# Patient Record
Sex: Male | Born: 1960 | Race: Black or African American | Hispanic: No | Smoking: Current every day smoker
Health system: Southern US, Community
[De-identification: ages and names within clinical notes are randomized; demographics above are authoritative.]

## PROBLEM LIST (undated history)

## (undated) DIAGNOSIS — M109 Gout, unspecified: Secondary | ICD-10-CM

## (undated) DIAGNOSIS — I10 Essential (primary) hypertension: Secondary | ICD-10-CM

## (undated) DIAGNOSIS — E119 Type 2 diabetes mellitus without complications: Secondary | ICD-10-CM

## (undated) DIAGNOSIS — M1990 Unspecified osteoarthritis, unspecified site: Secondary | ICD-10-CM

## (undated) DIAGNOSIS — C61 Malignant neoplasm of prostate: Secondary | ICD-10-CM

## (undated) HISTORY — PX: PROSTATE BIOPSY: SHX241

## (undated) HISTORY — PX: WISDOM TOOTH EXTRACTION: SHX21

---

## 2001-01-01 ENCOUNTER — Ambulatory Visit (HOSPITAL_COMMUNITY): Admission: RE | Admit: 2001-01-01 | Discharge: 2001-01-01 | Payer: Self-pay | Admitting: Internal Medicine

## 2001-01-01 ENCOUNTER — Encounter: Payer: Self-pay | Admitting: Internal Medicine

## 2008-03-14 ENCOUNTER — Ambulatory Visit (HOSPITAL_COMMUNITY): Admission: RE | Admit: 2008-03-14 | Discharge: 2008-03-14 | Payer: Self-pay | Admitting: Family Medicine

## 2008-03-14 ENCOUNTER — Encounter: Payer: Self-pay | Admitting: Orthopedic Surgery

## 2008-04-05 ENCOUNTER — Ambulatory Visit: Payer: Self-pay | Admitting: Orthopedic Surgery

## 2008-04-05 DIAGNOSIS — M25569 Pain in unspecified knee: Secondary | ICD-10-CM | POA: Insufficient documentation

## 2008-04-05 DIAGNOSIS — M25579 Pain in unspecified ankle and joints of unspecified foot: Secondary | ICD-10-CM | POA: Insufficient documentation

## 2012-10-04 ENCOUNTER — Ambulatory Visit (HOSPITAL_COMMUNITY)
Admission: RE | Admit: 2012-10-04 | Discharge: 2012-10-04 | Disposition: A | Discharge status: Home / Self Care | Payer: Commercial Managed Care - PPO | Source: Ambulatory Visit | Attending: Internal Medicine | Admitting: Internal Medicine

## 2012-10-04 ENCOUNTER — Other Ambulatory Visit (HOSPITAL_COMMUNITY): Payer: Self-pay | Admitting: Internal Medicine

## 2012-10-04 DIAGNOSIS — M25579 Pain in unspecified ankle and joints of unspecified foot: Secondary | ICD-10-CM | POA: Insufficient documentation

## 2012-10-04 DIAGNOSIS — S93409A Sprain of unspecified ligament of unspecified ankle, initial encounter: Secondary | ICD-10-CM

## 2013-09-14 ENCOUNTER — Ambulatory Visit (HOSPITAL_COMMUNITY)
Admission: RE | Admit: 2013-09-14 | Discharge: 2013-09-14 | Disposition: A | Discharge status: Home / Self Care | Payer: Commercial Managed Care - PPO | Source: Ambulatory Visit | Attending: Family Medicine | Admitting: Family Medicine

## 2013-09-14 ENCOUNTER — Other Ambulatory Visit (HOSPITAL_COMMUNITY): Payer: Self-pay | Admitting: Family Medicine

## 2013-09-14 DIAGNOSIS — R1031 Right lower quadrant pain: Secondary | ICD-10-CM

## 2013-09-14 MED ORDER — IOHEXOL 300 MG/ML  SOLN
100.0000 mL | Freq: Once | INTRAMUSCULAR | Status: AC | PRN
  Administered 2013-09-14: 100 mL via INTRAVENOUS

## 2013-09-15 ENCOUNTER — Ambulatory Visit (HOSPITAL_COMMUNITY): Payer: Commercial Managed Care - PPO

## 2013-09-15 ENCOUNTER — Other Ambulatory Visit (HOSPITAL_COMMUNITY): Payer: Commercial Managed Care - PPO

## 2013-10-13 ENCOUNTER — Telehealth (INDEPENDENT_AMBULATORY_CARE_PROVIDER_SITE_OTHER): Payer: Self-pay | Admitting: *Deleted

## 2013-10-13 ENCOUNTER — Encounter (INDEPENDENT_AMBULATORY_CARE_PROVIDER_SITE_OTHER): Payer: Self-pay | Admitting: Internal Medicine

## 2013-10-13 ENCOUNTER — Ambulatory Visit (INDEPENDENT_AMBULATORY_CARE_PROVIDER_SITE_OTHER): Payer: Commercial Managed Care - PPO | Admitting: Internal Medicine

## 2013-10-13 ENCOUNTER — Encounter (HOSPITAL_COMMUNITY): Payer: Self-pay | Admitting: Pharmacy Technician

## 2013-10-13 ENCOUNTER — Other Ambulatory Visit (INDEPENDENT_AMBULATORY_CARE_PROVIDER_SITE_OTHER): Payer: Self-pay | Admitting: *Deleted

## 2013-10-13 VITALS — BP 102/70 | HR 65 | Temp 98.6°F | Ht 71.0 in | Wt 214.9 lb

## 2013-10-13 DIAGNOSIS — R52 Pain, unspecified: Secondary | ICD-10-CM

## 2013-10-13 DIAGNOSIS — R1031 Right lower quadrant pain: Secondary | ICD-10-CM

## 2013-10-13 DIAGNOSIS — G8929 Other chronic pain: Secondary | ICD-10-CM | POA: Insufficient documentation

## 2013-10-13 DIAGNOSIS — Z1211 Encounter for screening for malignant neoplasm of colon: Secondary | ICD-10-CM

## 2013-10-13 MED ORDER — PEG-KCL-NACL-NASULF-NA ASC-C 100 G PO SOLR: 1.0000 | Freq: Once | ORAL | Status: DC

## 2013-10-13 NOTE — Progress Notes (Signed)
Subjective:     Patient ID: Keith Braun, male   DOB: 01/11/1961, 53 y.o.   MRN: [id number]  HPI Referred to our office by [facility] (Dr. Gerarda Fraction) for lower abdominal pain/colonoscopy. He was treated for a UTI after seeing Dr. Gerarda Fraction with Cipro 500mg  BID x 7 days. .  Present today with c/o rt lower abdominal  Pain which radiates into his back. He says when he squats or when he sits for a long period of time the pain worsens. Denies any back injury. 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 Lifting sometimes aggravates his pain. He had had this pain about a month. He denies urinary symptoms are having a fever His appetite is good. No weight loss.  Usually has a BM about 3 times a day and then sometimes one a day. He denies seeing any melena or bright red rectal bleeding.   09/14/2013 CT abdomen/pelvis with CM: (Rt lower quadrant pain). IMPRESSION:  1. No acute process in the abdomen or pelvis.  2. Probable moderate to marked right-sided gynecomastia.  Incompletely imaged. Consider physical exam correlation.   09/13/2013 H and H 13.4 and 40.3, MCV 85.0, total bili 0.7, ALP 67, AST 16, ALT 28, Total oprotein 7.5, Albumin 4.5   Review of Systems History reviewed. No pertinent past medical history.  History reviewed. No pertinent past surgical history.  No Known Allergies  No current outpatient prescriptions on file prior to visit.   No current facility-administered medications on file prior to visit.   Divorced,  1 child in good health.      Objective:   Physical Exam  Filed Vitals:   10/13/13 1018  BP: 102/70  Pulse: 65  Temp: 98.6 F (37 C)  Height: 5\' 11"  (1.803 m)  Weight: 214 lb 14.4 oz (97.478 kg)  Alert and oriented. Skin warm and dry. Oral mucosa is moist.   . Sclera anicteric, conjunctivae is pink. Thyroid not enlarged. No cervical lymphadenopathy. Lungs clear. Heart regular rate and rhythm.  Abdomen is soft. Bowel sounds are positive. No  hepatomegaly. No abdominal masses felt. No tenderness today.  No edema to lower extremities.        Assessment:    Rt lower abdominal pain. Sound more MS than anything. When he has pain, it is         worse when bending over or sitting for long periods of time.  He is however due for a screening colonoscopy. He has no alarm symptoms.    Plan:    Screening colonoscopy.The risks and benefits such as perforation, bleeding, and infection were reviewed with the patient and is agreeable.

## 2013-10-13 NOTE — Patient Instructions (Signed)
Screening colonoscopy 

## 2013-10-13 NOTE — Telephone Encounter (Signed)
Patient needs movi prep 

## 2013-11-03 ENCOUNTER — Encounter (HOSPITAL_COMMUNITY): Payer: Self-pay | Admitting: *Deleted

## 2013-11-03 ENCOUNTER — Ambulatory Visit (HOSPITAL_COMMUNITY)
Admission: RE | Admit: 2013-11-03 | Discharge: 2013-11-03 | Disposition: A | Discharge status: Home / Self Care | Payer: Commercial Managed Care - PPO | Source: Ambulatory Visit | Attending: Internal Medicine | Admitting: Internal Medicine

## 2013-11-03 ENCOUNTER — Encounter (HOSPITAL_COMMUNITY)
Admission: RE | Disposition: A | Discharge status: Home / Self Care | Payer: Self-pay | Source: Ambulatory Visit | Attending: Internal Medicine

## 2013-11-03 DIAGNOSIS — K5730 Diverticulosis of large intestine without perforation or abscess without bleeding: Secondary | ICD-10-CM

## 2013-11-03 DIAGNOSIS — Z1211 Encounter for screening for malignant neoplasm of colon: Secondary | ICD-10-CM | POA: Insufficient documentation

## 2013-11-03 DIAGNOSIS — D126 Benign neoplasm of colon, unspecified: Secondary | ICD-10-CM

## 2013-11-03 DIAGNOSIS — R1031 Right lower quadrant pain: Secondary | ICD-10-CM

## 2013-11-03 HISTORY — PX: COLONOSCOPY: SHX5424

## 2013-11-03 SURGERY — COLONOSCOPY
Anesthesia: Moderate Sedation

## 2013-11-03 MED ORDER — MEPERIDINE HCL 50 MG/ML IJ SOLN
INTRAMUSCULAR | Status: AC
  Filled 2013-11-03: qty 1

## 2013-11-03 MED ORDER — MIDAZOLAM HCL 5 MG/5ML IJ SOLN
INTRAMUSCULAR | Status: AC
  Filled 2013-11-03: qty 10

## 2013-11-03 MED ORDER — MIDAZOLAM HCL 5 MG/5ML IJ SOLN
INTRAMUSCULAR | Status: DC | PRN
  Administered 2013-11-03 (×2): 2 mg via INTRAVENOUS
  Administered 2013-11-03: 1 mg via INTRAVENOUS

## 2013-11-03 MED ORDER — STERILE WATER FOR IRRIGATION IR SOLN
Status: DC | PRN
  Administered 2013-11-03: 09:00:00

## 2013-11-03 MED ORDER — MEPERIDINE HCL 50 MG/ML IJ SOLN
INTRAMUSCULAR | Status: DC | PRN
  Administered 2013-11-03 (×2): 25 mg via INTRAVENOUS

## 2013-11-03 MED ORDER — SODIUM CHLORIDE 0.9 % IV SOLN
INTRAVENOUS | Status: DC
  Administered 2013-11-03: 1000 mL via INTRAVENOUS

## 2013-11-03 NOTE — H&P (Signed)
Keith Braun is an 53 y.o. male.   Chief Complaint: Patient is here for colonoscopy. HPI: Patient is 53 year old male is here for screening colonoscopy. He denies change in his bowel habits rectal bleeding. He does complain of intermittent pain in the right lower quadrant in the region of the anterior superior iliac spine and it radiates back. He was recently treated for UTI without resolution of his pain. He is presently pain-free. Family history is negative for CRC.  No past medical history on file.  History reviewed. No pertinent past surgical history.  History reviewed. No pertinent family history. Social History:  reports that he has been smoking.  He does not have any smokeless tobacco history on file. He reports that he drinks alcohol. He reports that he does not use illicit drugs.  Allergies: No Known Allergies  Medications Prior to Admission  Medication Sig Dispense Refill  . naproxen sodium (ALEVE) 220 MG tablet Take 220 mg by mouth daily as needed.      . peg 3350 powder (MOVIPREP) 100 G SOLR Take 1 kit (200 g total) by mouth once.  1 kit  0    No results found for this or any previous visit (from the past 48 hour(s)). No results found.  ROS  Blood pressure 142/94, pulse 74, temperature 98 F (36.7 C), temperature source Oral, resp. rate 16, height 5' 11" (1.803 m), weight 214 lb (97.07 kg), SpO2 100.00%. Physical Exam  Constitutional: He appears well-developed and well-nourished.  HENT:  Mouth/Throat: Oropharynx is clear and moist.  Eyes: Conjunctivae are normal. No scleral icterus.  Neck: No thyromegaly present.  Cardiovascular: Normal rate, regular rhythm and normal heart sounds.   No murmur heard. Respiratory: Effort normal and breath sounds normal.  GI: Soft. He exhibits no distension and no mass. There is no tenderness.  Musculoskeletal: He exhibits no edema.  Lymphadenopathy:    He has no cervical adenopathy.  Neurological: He is alert.  Skin: Skin is  warm and dry.     Assessment/Plan Average risk screening colonoscopy. History of right lower quadrant abdominal pain felt to be musculoskeletal or pain radiating from his back.  Keith Braun U 11/03/2013, 8:31 AM

## 2013-11-03 NOTE — Discharge Instructions (Signed)
Use Aleve only on as-needed basis. High fiber diet. No driving for 24 hours. Physician will contact you with biopsy results    Colonoscopy, Care After Refer to this sheet in the next few weeks. These instructions provide you with information on caring for yourself after your procedure. Your health care provider may also give you more specific instructions. Your treatment has been planned according to current medical practices, but problems sometimes occur. Call your health care provider if you have any problems or questions after your procedure. WHAT TO EXPECT AFTER THE PROCEDURE  After your procedure, it is typical to have the following:  A small amount of blood in your stool.  Moderate amounts of gas and mild abdominal cramping or bloating. HOME CARE INSTRUCTIONS  Do not drive, operate machinery, or sign important documents for 24 hours.  You may shower and resume your regular physical activities, but move at a slower pace for the first 24 hours.  Take frequent rest periods for the first 24 hours.  Walk around or put a warm pack on your abdomen to help reduce abdominal cramping and bloating.  Drink enough fluids to keep your urine clear or pale yellow.  You may resume your normal diet as instructed by your health care provider. Avoid heavy or fried foods that are hard to digest.  Avoid drinking alcohol for 24 hours or as instructed by your health care provider.  Only take over-the-counter or prescription medicines as directed by your health care provider.  If a tissue sample (biopsy) was taken during your procedure:  Do not take aspirin or blood thinners for 7 days, or as instructed by your health care provider.  Do not drink alcohol for 7 days, or as instructed by your health care provider.  Eat soft foods for the first 24 hours. SEEK MEDICAL CARE IF: You have persistent spotting of blood in your stool 2 3 days after the procedure. SEEK IMMEDIATE MEDICAL CARE IF:  You  have more than a small spotting of blood in your stool.  You pass large blood clots in your stool.  Your abdomen is swollen (distended).  You have nausea or vomiting.  You have a fever.  You have increasing abdominal pain that is not relieved with medicine. Document Released: 03/25/2004 Document Revised: 06/01/2013 Document Reviewed: 04/18/2013 [facility] Patient Information 2014 [place]. Colon Polyps Polyps are lumps of extra tissue growing inside the body. Polyps can grow in the large intestine (colon). Most colon polyps are noncancerous (benign). However, some colon polyps can become cancerous over time. Polyps that are larger than a pea may be harmful. To be safe, caregivers remove and test all polyps. CAUSES  Polyps form when mutations in the genes cause your cells to grow and divide even though no more tissue is needed. RISK FACTORS There are a number of risk factors that can increase your chances of getting colon polyps. They include:  Being older than 50 years.  Family history of colon polyps or colon cancer.  Long-term colon diseases, such as colitis or Crohn disease.  Being overweight.  Smoking.  Being inactive.  Drinking too much alcohol. SYMPTOMS  Most small polyps do not cause symptoms. If symptoms are present, they may include:  Blood in the stool. The stool may look dark red or black.  Constipation or diarrhea that lasts longer than 1 week. DIAGNOSIS People often do not know they have polyps until their caregiver finds them during a regular checkup. Your caregiver can use 4 tests  to check for polyps:  Digital rectal exam. The caregiver wears gloves and feels inside the rectum. This test would find polyps only in the rectum.  Barium enema. The caregiver puts a liquid called barium into your rectum before taking X-rays of your colon. Barium makes your colon look white. Polyps are dark, so they are easy to see in the X-ray pictures.  Sigmoidoscopy. A  thin, flexible tube (sigmoidoscope) is placed into your rectum. The sigmoidoscope has a light and tiny camera in it. The caregiver uses the sigmoidoscope to look at the last third of your colon.  Colonoscopy. This test is like sigmoidoscopy, but the caregiver looks at the entire colon. This is the most common method for finding and removing polyps. TREATMENT  Any polyps will be removed during a sigmoidoscopy or colonoscopy. The polyps are then tested for cancer. PREVENTION  To help lower your risk of getting more colon polyps:  Eat plenty of fruits and vegetables. Avoid eating fatty foods.  Do not smoke.  Avoid drinking alcohol.  Exercise every day.  Lose weight if recommended by your caregiver.  Eat plenty of calcium and folate. Foods that are rich in calcium include milk, cheese, and broccoli. Foods that are rich in folate include chickpeas, kidney beans, and spinach. HOME CARE INSTRUCTIONS Keep all follow-up appointments as directed by your caregiver. You may need periodic exams to check for polyps. SEEK MEDICAL CARE IF: You notice bleeding during a bowel movement. Document Released: 05/07/2004 Document Revised: 11/03/2011 Document Reviewed: 10/21/2011 [facility] Patient Information 2014 [place]. High-Fiber Diet Fiber is found in fruits, vegetables, and grains. A high-fiber diet encourages the addition of more whole grains, legumes, fruits, and vegetables in your diet. The recommended amount of fiber for adult males is 38 g per day. For adult females, it is 25 g per day. Pregnant and lactating women should get 28 g of fiber per day. If you have a digestive or bowel problem, ask your caregiver for advice before adding high-fiber foods to your diet. Eat a variety of high-fiber foods instead of only a select few type of foods.  PURPOSE  To increase stool bulk.  To make bowel movements more regular to prevent constipation.  To lower cholesterol.  To prevent overeating. WHEN  IS THIS DIET USED?  It may be used if you have constipation and hemorrhoids.  It may be used if you have uncomplicated diverticulosis (intestine condition) and irritable bowel syndrome.  It may be used if you need help with weight management.  It may be used if you want to add it to your diet as a protective measure against atherosclerosis, diabetes, and cancer. SOURCES OF FIBER  Whole-grain breads and cereals.  Fruits, such as apples, oranges, bananas, berries, prunes, and pears.  Vegetables, such as green peas, carrots, sweet potatoes, beets, broccoli, cabbage, spinach, and artichokes.  Legumes, such split peas, soy, lentils.  Almonds. FIBER CONTENT IN FOODS Starches and Grains / Dietary Fiber (g)  Cheerios, 1 cup / 3 g  Corn Flakes cereal, 1 cup / 0.7 g  Rice crispy treat cereal, 1 cup / 0.3 g  Instant oatmeal (cooked),  cup / 2 g  Frosted wheat cereal, 1 cup / 5.1 g  Brown, long-grain rice (cooked), 1 cup / 3.5 g  White, long-grain rice (cooked), 1 cup / 0.6 g  Enriched macaroni (cooked), 1 cup / 2.5 g Legumes / Dietary Fiber (g)  Baked beans (canned, plain, or vegetarian),  cup / 5.2 g  Kidney beans (canned),  cup / 6.8 g  Pinto beans (cooked),  cup / 5.5 g Breads and Crackers / Dietary Fiber (g)  Plain or honey graham crackers, 2 squares / 0.7 g  Saltine crackers, 3 squares / 0.3 g  Plain, salted pretzels, 10 pieces / 1.8 g  Whole-wheat bread, 1 slice / 1.9 g  White bread, 1 slice / 0.7 g  Raisin bread, 1 slice / 1.2 g  Plain bagel, 3 oz / 2 g  Flour tortilla, 1 oz / 0.9 g  Corn tortilla, 1 small / 1.5 g  Hamburger or hotdog bun, 1 small / 0.9 g Fruits / Dietary Fiber (g)  Apple with skin, 1 medium / 4.4 g  Sweetened applesauce,  cup / 1.5 g  Banana,  medium / 1.5 g  Grapes, 10 grapes / 0.4 g  Orange, 1 small / 2.3 g  Raisin, 1.5 oz / 1.6 g  Melon, 1 cup / 1.4 g Vegetables / Dietary Fiber (g)  Green beans (canned),  cup  / 1.3 g  Carrots (cooked),  cup / 2.3 g  Broccoli (cooked),  cup / 2.8 g  Peas (cooked),  cup / 4.4 g  Mashed potatoes,  cup / 1.6 g  Lettuce, 1 cup / 0.5 g  Corn (canned),  cup / 1.6 g  Tomato,  cup / 1.1 g Document Released: 08/11/2005 Document Revised: 02/10/2012 Document Reviewed: 11/13/2011 [facility] Patient Information 2014 [place]. Diverticulosis Diverticulosis is a common condition that develops when small pouches (diverticula) form in the wall of the colon. The risk of diverticulosis increases with age. It happens more often in people who eat a low-fiber diet. Most individuals with diverticulosis have no symptoms. Those individuals with symptoms usually experience abdominal pain, constipation, or loose stools (diarrhea). HOME CARE INSTRUCTIONS   Increase the amount of fiber in your diet as directed by your caregiver or dietician. This may reduce symptoms of diverticulosis.  Your caregiver may recommend taking a dietary fiber supplement.  Drink at least 6 to 8 glasses of water each day to prevent constipation.  Try not to strain when you have a bowel movement.  Your caregiver may recommend avoiding nuts and seeds to prevent complications, although this is still an uncertain benefit.  Only take over-the-counter or prescription medicines for pain, discomfort, or fever as directed by your caregiver. FOODS WITH HIGH FIBER CONTENT INCLUDE:  Fruits. Apple, peach, pear, tangerine, raisins, prunes.  Vegetables. Brussels sprouts, asparagus, broccoli, cabbage, carrot, cauliflower, romaine lettuce, spinach, summer squash, tomato, winter squash, zucchini.  Starchy Vegetables. Baked beans, kidney beans, lima beans, split peas, lentils, potatoes (with skin).  Grains. Whole wheat bread, brown rice, bran flake cereal, plain oatmeal, white rice, shredded wheat, bran muffins. SEEK IMMEDIATE MEDICAL CARE IF:   You develop increasing pain or severe bloating.  You have an  oral temperature above 102 F (38.9 C), not controlled by medicine.  You develop vomiting or bowel movements that are bloody or black. Document Released: 05/08/2004 Document Revised: 11/03/2011 Document Reviewed: 01/09/2010 [facility] Patient Information 2014 [place].

## 2013-11-03 NOTE — Op Note (Signed)
COLONOSCOPY PROCEDURE REPORT  PATIENT:  Keith Braun  MR#:  [id number] Birthdate:  [birth year], 53 y.o., male Endoscopist:  Dr. Rogene Houston, MD Referred By:  Dr. Sherrilee Gilles. Gerarda Fraction, MD  Procedure Date: 11/03/2013  Procedure:   Colonoscopy  Indications:  Patient is 53 year old African male who is undergoing average risk screening colonoscopy. He was recently evaluated for right lower quadrant abdominal pain felt to be musculoskeletal her pain referred from his back.  Informed Consent:  The procedure and risks were reviewed with the patient and informed consent was obtained.  Medications:  Demerol 50 mg IV Versed 5 mg IV  Description of procedure:  After a digital rectal exam was performed, that colonoscope was advanced from the anus through the rectum and colon to the area of the cecum, ileocecal valve and appendiceal orifice. The cecum was deeply intubated. These structures were well-seen and photographed for the record. From the level of the cecum and ileocecal valve, the scope was slowly and cautiously withdrawn. The mucosal surfaces were carefully surveyed utilizing scope tip to flexion to facilitate fold flattening as needed. The scope was pulled down into the rectum where a thorough exam including retroflexion was performed.  Findings:   Prep satisfactory. Multiple diverticula noted at cecum and ascending colon with a few more scattered through rest of the colon. Two small polyps ablated via cold biopsy from sigmoid colon and submitted together. Normal mucosa of rectum and anorectal junction.   Therapeutic/Diagnostic Maneuvers Performed:  See above  Complications:  None  Cecal Withdrawal Time:  10 minutes  Impression:  Examination performed to cecum. Multiple diverticula at cecum and ascending colon with few more in rest of the colon. Two small polyps ablated via cold biopsy from sigmoid colon and submitted together.  Recommendations:  Standard instructions  given. High fiber diet. Patient advised to keep OTC NSAID use to minimum. I will contact patient with biopsy results and further recommendations.  Keith Braun  11/03/2013 9:05 AM  CC: Dr. Glo Herring., MD & Dr. Rayne Du ref. provider found

## 2013-11-07 ENCOUNTER — Encounter (HOSPITAL_COMMUNITY): Payer: Self-pay | Admitting: Internal Medicine

## 2013-11-17 ENCOUNTER — Encounter (INDEPENDENT_AMBULATORY_CARE_PROVIDER_SITE_OTHER): Payer: Self-pay | Admitting: *Deleted

## 2014-08-16 ENCOUNTER — Encounter (INDEPENDENT_AMBULATORY_CARE_PROVIDER_SITE_OTHER): Payer: Self-pay

## 2014-09-07 IMAGING — CT CT ABD-PELV W/ CM
2 of 4 series · 16 of 46 positions shown, 18 images · IV contrast (Omnipaque 300)
Comparison: None.

CLINICAL DATA: Right lower quadrant pain for 3 weeks.

EXAM:
CT ABDOMEN AND PELVIS WITH CONTRAST
TECHNIQUE: Multidetector CT imaging of the abdomen and pelvis was performed
using the standard protocol following bolus administration of
intravenous contrast.
CONTRAST:  100mL OMNIPAQUE IOHEXOL 300 MG/ML  SOLN

[Series 2: abd_pel_with 5.0 b40f · axial · 0.74mm/px · z∈[-429,-29]mm · 13 of 90 slices shown, 15 images]
[im 5/90  soft-tissue]
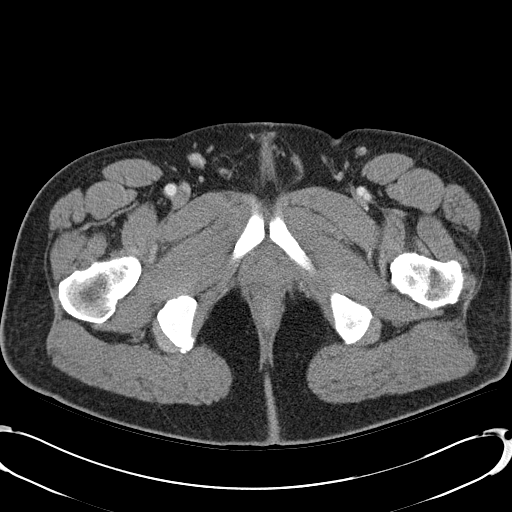
[im 5/90  bone]
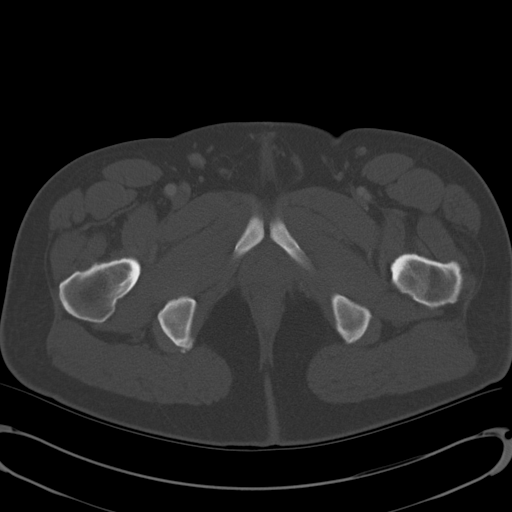
[im 13/90  soft-tissue]
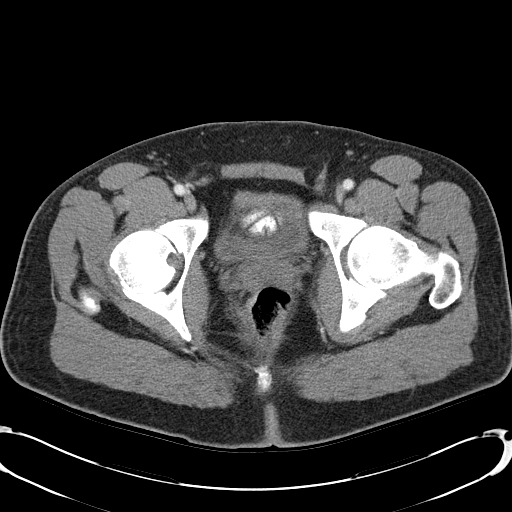
[im 17/90  soft-tissue]
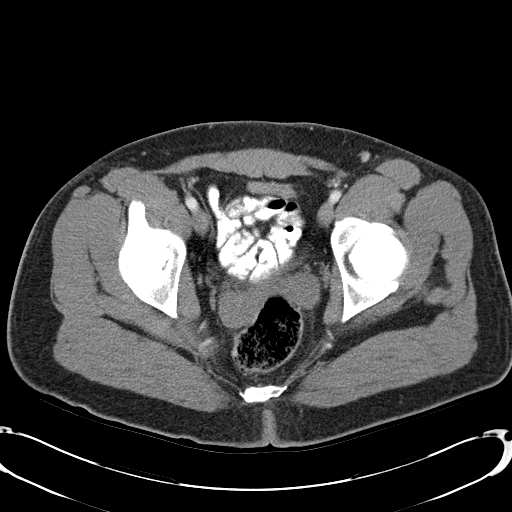
[im 26/90  soft-tissue]
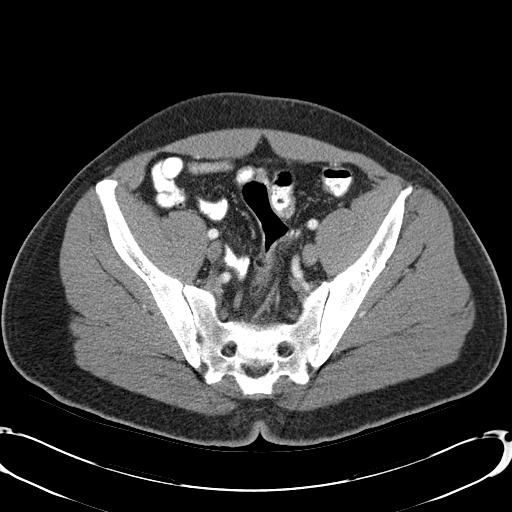
[im 30/90  soft-tissue]
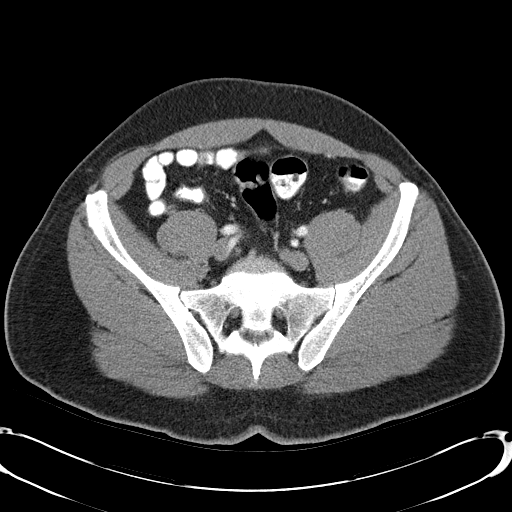
[im 39/90  soft-tissue]
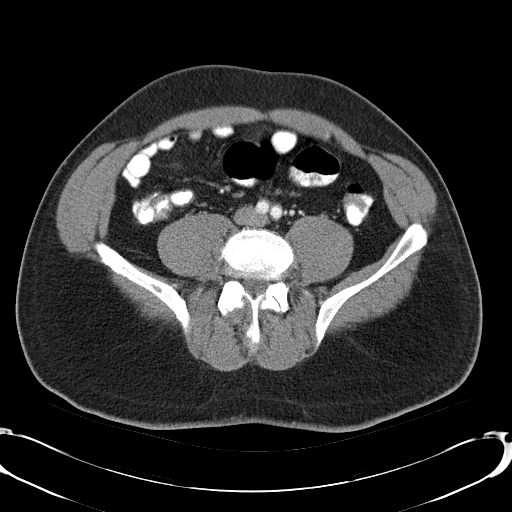
[im 47/90  soft-tissue]
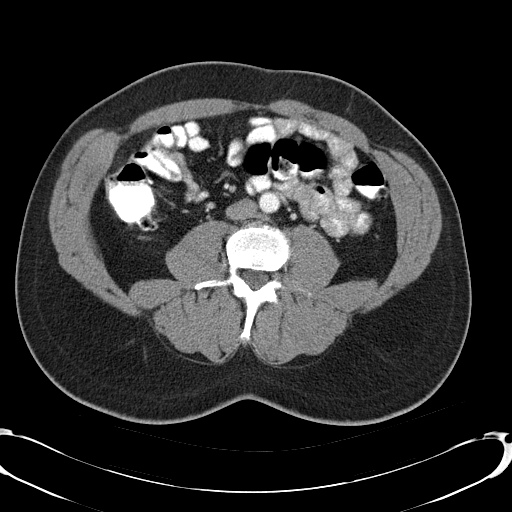
[im 51/90  soft-tissue]
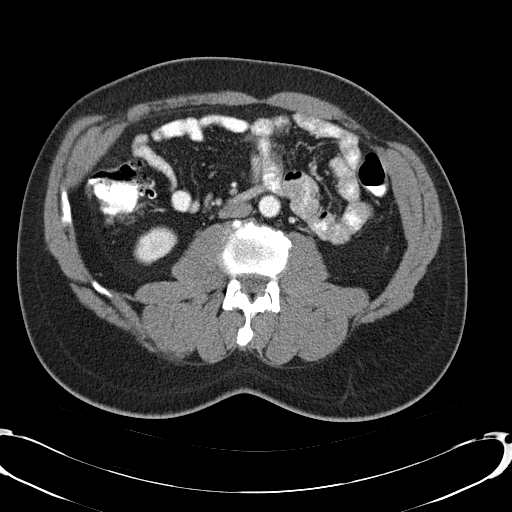
[im 60/90  soft-tissue]
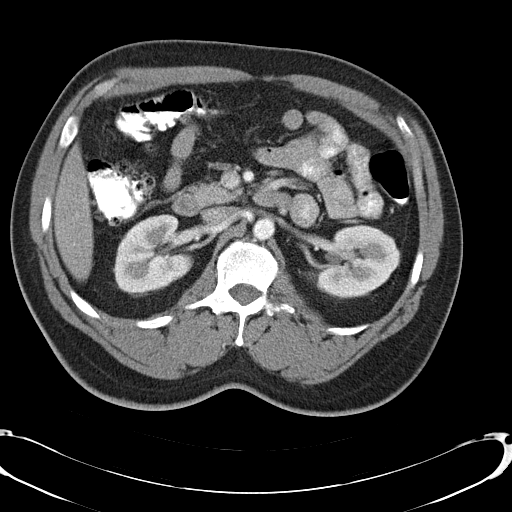
[im 60/90  bone]
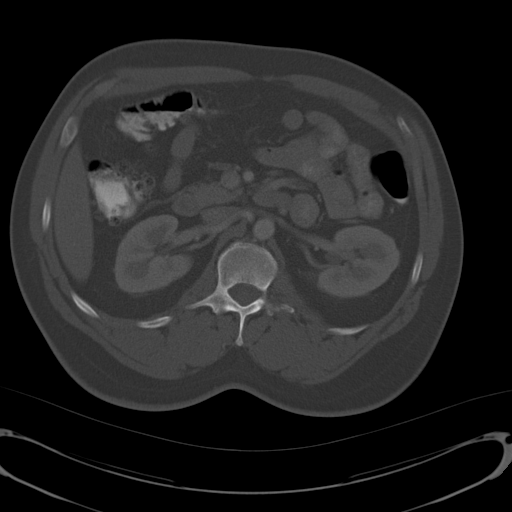
[im 64/90  soft-tissue]
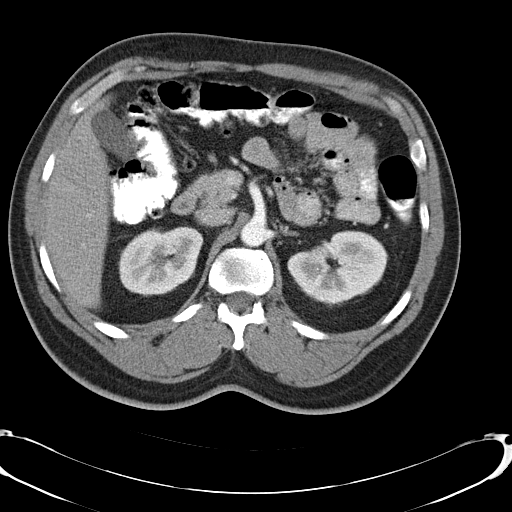
[im 73/90  soft-tissue]
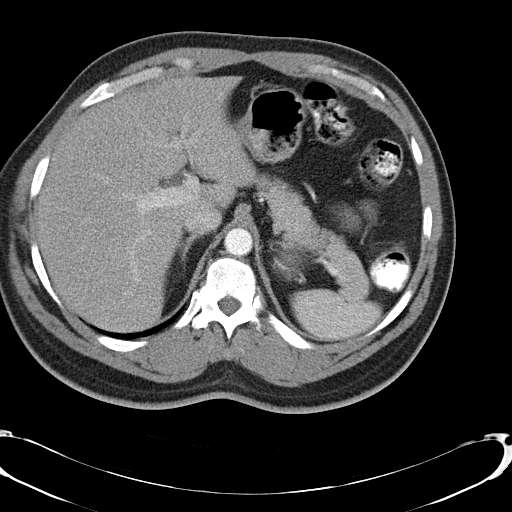
[im 77/90  soft-tissue]
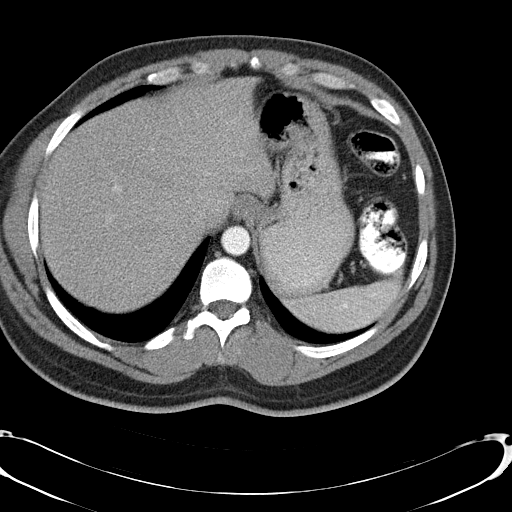
[im 85/90  soft-tissue]
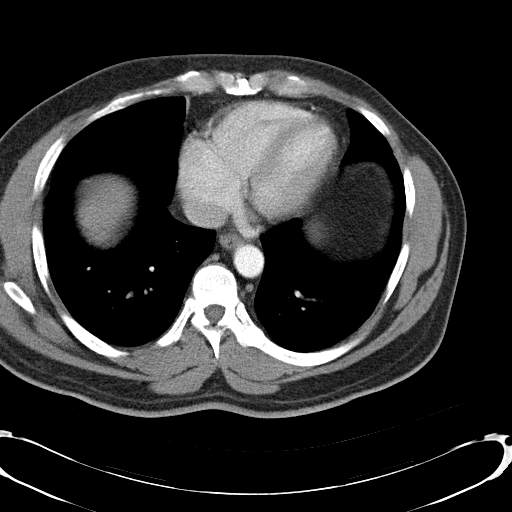

[Series 4: abd_pel_with 3.0 spo · coronal · 0.65mm/px · 3 of 91 slices shown]
[im 31/91  soft-tissue]
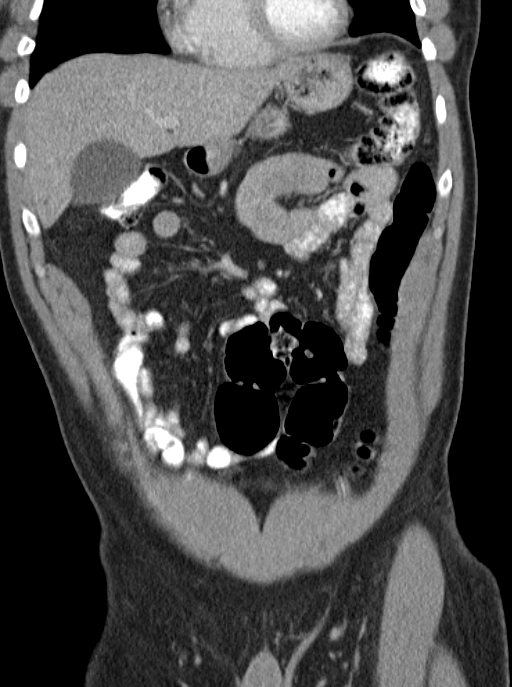
[im 41/91  soft-tissue]
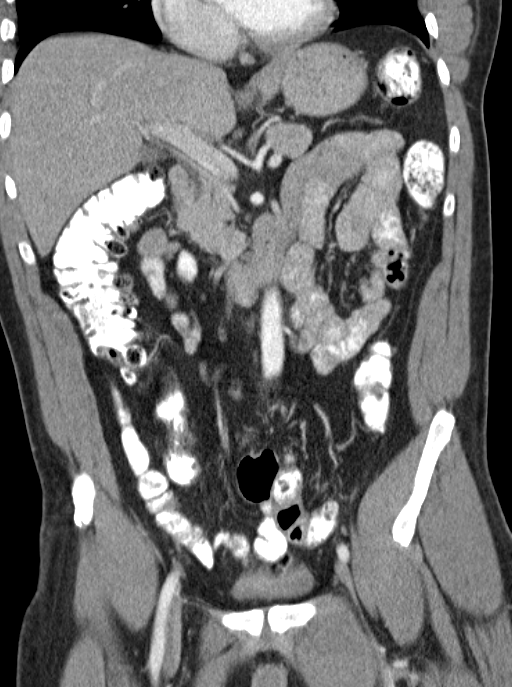
[im 51/91  soft-tissue]
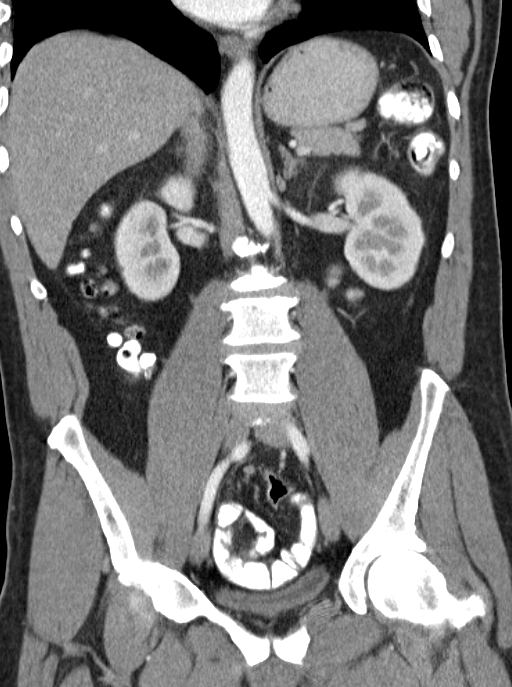

[16 of 46 positions shown; findings below may reference images not displayed]

FINDINGS: Lower Chest: Mild scarring at the anterior left lung base. Normal
heart size without pericardial or pleural effusion. Incompletely
imaged probable moderate to marked right-sided gynecomastia. Image
1.

Abdomen/Pelvis: Normal liver, spleen, stomach, pancreas,
gallbladder, biliary tract, adrenal glands, kidneys. No
retroperitoneal or retrocrural adenopathy. Scattered colonic
diverticula. Normal terminal ileum and appendix. Normal small bowel
without abdominal ascites. Bilateral fat containing inguinal
hernias. No pelvic adenopathy. Normal urinary bladder and prostate.
No significant free fluid.

Bones/Musculoskeletal:  No acute osseous abnormality.
IMPRESSION: 1.  No acute process in the abdomen or pelvis.
2. Probable moderate to marked right-sided gynecomastia.
Incompletely imaged. Consider physical exam correlation.

## 2021-04-25 ENCOUNTER — Ambulatory Visit: Payer: Self-pay | Admitting: Orthopaedic Surgery

## 2021-04-25 ENCOUNTER — Ambulatory Visit: Payer: Self-pay

## 2021-04-25 ENCOUNTER — Encounter: Payer: Self-pay | Admitting: Orthopaedic Surgery

## 2021-04-25 ENCOUNTER — Ambulatory Visit (INDEPENDENT_AMBULATORY_CARE_PROVIDER_SITE_OTHER): Payer: Self-pay | Admitting: Orthopaedic Surgery

## 2021-04-25 ENCOUNTER — Other Ambulatory Visit: Payer: Self-pay

## 2021-04-25 VITALS — BP 113/79 | HR 120 | Ht 71.0 in | Wt 188.6 lb

## 2021-04-25 DIAGNOSIS — G8929 Other chronic pain: Secondary | ICD-10-CM

## 2021-04-25 DIAGNOSIS — M25572 Pain in left ankle and joints of left foot: Secondary | ICD-10-CM

## 2021-04-25 DIAGNOSIS — M25511 Pain in right shoulder: Secondary | ICD-10-CM

## 2021-04-25 DIAGNOSIS — R634 Abnormal weight loss: Secondary | ICD-10-CM

## 2021-04-25 DIAGNOSIS — M1009 Idiopathic gout, multiple sites: Secondary | ICD-10-CM

## 2021-04-25 MED ORDER — PREDNISONE 10 MG (21) PO TBPK: ORAL_TABLET | ORAL | 1 refills | Status: DC

## 2021-04-25 NOTE — Progress Notes (Signed)
Subjective:    Patient ID: Keith Braun, male    DOB: June 26, 1961, 60 y.o.   MRN: [id number]  HPI He has two to three week history of pain in both feet, more on the left foot laterally.  He has no trauma.  He has no redness, no swelling.  He has tried Tylenol, rubs, ice, heat with no help.  He has no change in shoe wear.  He has had problems with the right shoulder for several months, some days more than others.  He drives a fork lift at work.  He has no trauma.  He has more pain with overhead use.  He has no weakness, no numbness.  It has been better over the last week or two.  He has lost weight over the last six weeks.  He weighed 219 then and now weighs 188.  He just does not feel like eating.  He has abdominal pain.  He is scheduled for colonoscopy in another few weeks.  He has seen another provider for this.  He is concerned about his weight loss.  He just does not feel well, does not feel energized like he normally should he says.  His father had gout, his son has gout.  He has never been told he has gout.  He has not been tested for it.   Review of Systems  Constitutional:  Positive for activity change, appetite change, fatigue and unexpected weight change.  Gastrointestinal:  Positive for abdominal pain and constipation.       Weight loss of over 30 pounds over last six weeks without trying to lose weight.  Musculoskeletal:  Positive for arthralgias and gait problem.  All other systems reviewed and are negative. For Review of Systems, all other systems reviewed and are negative.  The following is a summary of the past history medically, past history surgically, known current medicines, social history and family history.  This information is gathered electronically by the computer from prior information and documentation.  I review this each visit and have found including this information at this point in the chart is beneficial and informative.   History reviewed. No pertinent  past medical history.  Past Surgical History:  Procedure Laterality Date   COLONOSCOPY N/A 11/03/2013   Procedure: COLONOSCOPY;  Surgeon: Rogene Houston, MD;  [facility];  Service: Endoscopy;  Laterality: N/A;  830    Current Outpatient Medications on File Prior to Visit  Medication Sig Dispense Refill   naproxen sodium (ALEVE) 220 MG tablet Take 220 mg by mouth daily as needed.     No current facility-administered medications on file prior to visit.    Social History   Socioeconomic History   Marital status: Divorced    Spouse name: Not on file   Number of children: Not on file   Years of education: Not on file   Highest education level: Not on file  Occupational History   Not on file  Tobacco Use   Smoking status: Every Day   Smokeless tobacco: Never   Tobacco comments:    1 pack a week x 30 yrs.   Substance and Sexual Activity   Alcohol use: Yes    Comment: 1/5 liquor a week   Drug use: No   Sexual activity: Not on file  Other Topics Concern   Not on file  Social History Narrative   Not on file   Social Determinants of Health   Financial Resource Strain: Not on  file  Food Insecurity: Not on file  Transportation Needs: Not on file  Physical Activity: Not on file  Stress: Not on file  Social Connections: Not on file  Intimate Partner Violence: Not on file    History reviewed. No pertinent family history.  BP 113/79   Pulse (!) 120   Ht '5\' 11"'$  (1.803 m)   Wt 188 lb 9.6 oz (85.5 kg)   BMI 26.30 kg/m   Body mass index is 26.3 kg/m.     Objective:   Physical Exam Vitals and nursing note reviewed. Exam conducted with a chaperone present.  Constitutional:      Appearance: He is well-developed.  HENT:     Head: Normocephalic and atraumatic.  Eyes:     Conjunctiva/sclera: Conjunctivae normal.     Pupils: Pupils are equal, round, and reactive to light.  Cardiovascular:     Rate and Rhythm: Normal rate and regular rhythm.  Pulmonary:      Effort: Pulmonary effort is normal.  Abdominal:     Palpations: Abdomen is soft.  Musculoskeletal:       Arms:     Cervical back: Normal range of motion and neck supple.       Feet:  Skin:    General: Skin is warm and dry.  Neurological:     Mental Status: He is alert and oriented to person, place, and time.     Cranial Nerves: No cranial nerve deficit.     Motor: No abnormal muscle tone.     Coordination: Coordination normal.     Deep Tendon Reflexes: Reflexes are normal and symmetric. Reflexes normal.  Psychiatric:        Behavior: Behavior normal.        Thought Content: Thought content normal.        Judgment: Judgment normal.  X-rays were done of the right shoulder, left foot and left ankle, reported separately.        Assessment & Plan:   Encounter Diagnoses  Name Primary?   Chronic right shoulder pain Yes   Pain in left ankle and joints of left foot    Acute idiopathic gout of multiple sites    Weight loss, unintentional    I am concerned about the weight loss.  He is scheduled to have colonoscopy.  I am also concerned about possible gout.  I will get serum uric acid levels.  I will avoid giving NSAIDs as he is to have colonoscopy soon.  I will given prednisone dose pack.  I can fill out [facility] paperwork if he gives it to [place].  Return in one week.  Call if any problem.  Precautions discussed.  Electronically Signed Sanjuana Kava, MD 9/1/202211:21 AM

## 2021-04-26 LAB — URIC ACID: Uric Acid, Serum: 7 mg/dL (ref 4.0–8.0)

## 2021-05-02 ENCOUNTER — Encounter: Payer: Self-pay | Admitting: Orthopaedic Surgery

## 2021-05-02 ENCOUNTER — Ambulatory Visit (INDEPENDENT_AMBULATORY_CARE_PROVIDER_SITE_OTHER): Payer: Self-pay | Admitting: Orthopaedic Surgery

## 2021-05-02 ENCOUNTER — Other Ambulatory Visit: Payer: Self-pay

## 2021-05-02 VITALS — Wt 189.2 lb

## 2021-05-02 DIAGNOSIS — G8929 Other chronic pain: Secondary | ICD-10-CM

## 2021-05-02 DIAGNOSIS — M25511 Pain in right shoulder: Secondary | ICD-10-CM

## 2021-05-02 DIAGNOSIS — R634 Abnormal weight loss: Secondary | ICD-10-CM

## 2021-05-02 NOTE — Progress Notes (Signed)
My knee is a little tender.  His uric acid was 7.0, high normal. His right shoulder is less tender.  His weight is stable today, in fact he gained a pound.  He is scheduled for Colonoscopy 9-29.  He had a friend check his blood sugar and it was 295.  He is going to see Dr. Gerarda Fraction after he leaves here.  His other joint pains are less.  He is avoiding sugar products.  Encounter Diagnoses  Name Primary?   Chronic right shoulder pain Yes   Weight loss, unintentional    Check out for diabetes.  Get the colonoscopy.  I cannot give NSAIDs until after the colonoscopy.  Return in one month.  Call if any problem.  Precautions discussed.  Electronically Signed Sanjuana Kava, MD 9/8/202210:45 AM

## 2021-05-23 ENCOUNTER — Ambulatory Visit: Payer: Commercial Managed Care - PPO | Admitting: Urology

## 2021-05-30 ENCOUNTER — Encounter: Payer: Self-pay | Admitting: Orthopaedic Surgery

## 2021-05-30 ENCOUNTER — Other Ambulatory Visit: Payer: Self-pay

## 2021-05-30 ENCOUNTER — Ambulatory Visit (INDEPENDENT_AMBULATORY_CARE_PROVIDER_SITE_OTHER): Payer: Self-pay | Admitting: Orthopaedic Surgery

## 2021-05-30 VITALS — BP 126/86 | HR 76 | Ht 71.5 in | Wt 189.0 lb

## 2021-05-30 DIAGNOSIS — M25511 Pain in right shoulder: Secondary | ICD-10-CM

## 2021-05-30 DIAGNOSIS — G8929 Other chronic pain: Secondary | ICD-10-CM

## 2021-05-30 DIAGNOSIS — M1009 Idiopathic gout, multiple sites: Secondary | ICD-10-CM

## 2021-05-30 MED ORDER — ALLOPURINOL 300 MG PO TABS: 300.0000 mg | ORAL_TABLET | Freq: Every day | ORAL | 5 refills | Status: DC

## 2021-05-30 NOTE — Progress Notes (Signed)
I am better.  He is eating better and not losing weight.  It is stable now.  He did not get his colonoscopy secondary to insurance reasons.  He will get it.  His diabetes is better controlled.  His joints are tender in the right ankle but overall, improved.  ROM of ankle is full bilaterally with slight lateral swelling right ankle.  Shoulders have full ROM.  Encounter Diagnoses  Name Primary?   Chronic right shoulder pain Yes   Acute idiopathic gout of multiple sites    I will begin allopurinol.  Return prn.  Electronically Signed Sanjuana Kava, MD 10/6/202211:16 AM,

## 2021-06-07 ENCOUNTER — Encounter: Payer: Self-pay | Admitting: Orthopaedic Surgery

## 2021-10-17 DIAGNOSIS — Z6825 Body mass index (BMI) 25.0-25.9, adult: Secondary | ICD-10-CM | POA: Diagnosis not present

## 2021-10-17 DIAGNOSIS — R972 Elevated prostate specific antigen [PSA]: Secondary | ICD-10-CM | POA: Diagnosis not present

## 2021-10-17 DIAGNOSIS — E663 Overweight: Secondary | ICD-10-CM | POA: Diagnosis not present

## 2021-10-17 DIAGNOSIS — E118 Type 2 diabetes mellitus with unspecified complications: Secondary | ICD-10-CM | POA: Diagnosis not present

## 2021-10-17 DIAGNOSIS — Z1331 Encounter for screening for depression: Secondary | ICD-10-CM | POA: Diagnosis not present

## 2021-10-17 DIAGNOSIS — E1165 Type 2 diabetes mellitus with hyperglycemia: Secondary | ICD-10-CM | POA: Diagnosis not present

## 2022-05-02 DIAGNOSIS — Z6824 Body mass index (BMI) 24.0-24.9, adult: Secondary | ICD-10-CM | POA: Diagnosis not present

## 2022-05-02 DIAGNOSIS — E118 Type 2 diabetes mellitus with unspecified complications: Secondary | ICD-10-CM | POA: Diagnosis not present

## 2022-05-02 DIAGNOSIS — R972 Elevated prostate specific antigen [PSA]: Secondary | ICD-10-CM | POA: Diagnosis not present

## 2022-05-02 DIAGNOSIS — E119 Type 2 diabetes mellitus without complications: Secondary | ICD-10-CM | POA: Diagnosis not present

## 2023-05-28 DIAGNOSIS — R7303 Prediabetes: Secondary | ICD-10-CM | POA: Diagnosis not present

## 2023-05-28 DIAGNOSIS — E663 Overweight: Secondary | ICD-10-CM | POA: Diagnosis not present

## 2023-05-28 DIAGNOSIS — R972 Elevated prostate specific antigen [PSA]: Secondary | ICD-10-CM | POA: Diagnosis not present

## 2023-05-28 DIAGNOSIS — Z6825 Body mass index (BMI) 25.0-25.9, adult: Secondary | ICD-10-CM | POA: Diagnosis not present

## 2023-05-28 DIAGNOSIS — E118 Type 2 diabetes mellitus with unspecified complications: Secondary | ICD-10-CM | POA: Diagnosis not present

## 2023-05-28 DIAGNOSIS — Z1331 Encounter for screening for depression: Secondary | ICD-10-CM | POA: Diagnosis not present

## 2023-05-28 DIAGNOSIS — Z0001 Encounter for general adult medical examination with abnormal findings: Secondary | ICD-10-CM | POA: Diagnosis not present

## 2023-07-27 NOTE — Progress Notes (Signed)
   07/28/2023 8:22 AM   Keith Braun 12-05-1960 [id number]  Referring provider: Avis Epley, PA-C [address] [place],  [place] [address]  No chief complaint on file.   HPI: 62 yo male is here for further E/M of an elevated PSA. It was drawn @ [facility] in October 2024--> 5.5.    PMH: No past medical history on file.  Surgical History: Past Surgical History:  Procedure Laterality Date   COLONOSCOPY N/A 11/03/2013   Procedure: COLONOSCOPY;  Surgeon: Malissa Hippo, MD;  [facility];  Service: Endoscopy;  Laterality: N/A;  830    Home Medications:  Allergies as of 07/28/2023   No Known Allergies      Medication List        Accurate as of July 27, 2023  8:22 AM. If you have any questions, ask your nurse or doctor.          allopurinol 300 MG tablet Commonly known as: ZYLOPRIM Take 1 tablet (300 mg total) by mouth daily.   metFORMIN 500 MG tablet Commonly known as: GLUCOPHAGE Take 500 mg by mouth daily.   naproxen sodium 220 MG tablet Commonly known as: ALEVE Take 220 mg by mouth daily as needed.        Allergies: No Known Allergies  Family History: No family history on file.  Social History:  reports that he has been smoking. He has never used smokeless tobacco. He reports current alcohol use. He reports that he does not use drugs.  ROS: All other review of systems were reviewed and are negative except what is noted above in HPI  Physical Exam: There were no vitals taken for this visit.  Constitutional:  Alert and oriented, No acute distress. HEENT:  AT, moist mucus membranes.  Trachea midline, no masses. Cardiovascular: No clubbing, cyanosis, or edema. Respiratory: Normal respiratory effort, no increased work of breathing. GI: Abdomen is soft, nontender, nondistended, no abdominal masses GU: No CVA tenderness. Circumcised phallus. No masses/lesions on penis, testis, scrotum. Prostate 40g smooth no nodules  no induration.  Lymph: No cervical or inguinal lymphadenopathy. Skin: No rashes, bruises or suspicious lesions. Neurologic: Grossly intact, no focal deficits, moving all 4 extremities. Psychiatric: Normal mood and affect.  Laboratory Data: No results found for: "WBC", "HGB", "HCT", "MCV", "PLT"  No results found for: "CREATININE"  No results found for: "PSA"  No results found for: "TESTOSTERONE"  No results found for: "HGBA1C"  Urinalysis No results found for: "COLORURINE", "APPEARANCEUR", "LABSPEC", "PHURINE", "GLUCOSEU", "HGBUR", "BILIRUBINUR", "KETONESUR", "PROTEINUR", "UROBILINOGEN", "NITRITE", "LEUKOCYTESUR"  No results found for: "LABMICR", "WBCUA", "RBCUA", "LABEPIT", "MUCUS", "BACTERIA"  Pertinent Imaging: *** No results found for this or any previous visit.  No results found for this or any previous visit.  No results found for this or any previous visit.  No results found for this or any previous visit.  No results found for this or any previous visit.  No valid procedures specified. No results found for this or any previous visit.  No results found for this or any previous visit.   Assessment & Plan:    There are no diagnoses linked to this encounter.  No follow-ups on file.  Chelsea Aus, MD  [facility]

## 2023-07-28 ENCOUNTER — Ambulatory Visit: Payer: Self-pay | Admitting: Urology

## 2023-07-28 ENCOUNTER — Encounter: Payer: Self-pay | Admitting: Urology

## 2023-07-28 VITALS — BP 125/85 | HR 72

## 2023-07-28 DIAGNOSIS — R972 Elevated prostate specific antigen [PSA]: Secondary | ICD-10-CM | POA: Diagnosis not present

## 2023-07-28 DIAGNOSIS — R3129 Other microscopic hematuria: Secondary | ICD-10-CM | POA: Diagnosis not present

## 2023-07-28 LAB — URINALYSIS, ROUTINE W REFLEX MICROSCOPIC
Bilirubin, UA: NEGATIVE
Glucose, UA: NEGATIVE
Ketones, UA: NEGATIVE
Nitrite, UA: NEGATIVE
Protein,UA: NEGATIVE
RBC, UA: NEGATIVE
Specific Gravity, UA: 1.015 (ref 1.005–1.030)
Urobilinogen, Ur: 0.2 mg/dL (ref 0.2–1.0)
pH, UA: 6 (ref 5.0–7.5)

## 2023-07-28 LAB — MICROSCOPIC EXAMINATION: Bacteria, UA: NONE SEEN

## 2023-07-29 LAB — PSA: Prostate Specific Ag, Serum: 4.4 ng/mL — ABNORMAL HIGH (ref 0.0–4.0)

## 2023-07-30 LAB — URINE CULTURE

## 2023-08-03 ENCOUNTER — Other Ambulatory Visit: Payer: Self-pay | Admitting: Urology

## 2023-08-03 DIAGNOSIS — R972 Elevated prostate specific antigen [PSA]: Secondary | ICD-10-CM

## 2023-08-03 MED ORDER — LEVOFLOXACIN 750 MG PO TABS
750.0000 mg | ORAL_TABLET | Freq: Every day | ORAL | 0 refills | Status: AC
Start: 2023-08-03 — End: 2023-08-04

## 2023-08-04 ENCOUNTER — Telehealth: Payer: Self-pay

## 2023-08-04 ENCOUNTER — Other Ambulatory Visit: Payer: Self-pay

## 2023-08-04 DIAGNOSIS — R972 Elevated prostate specific antigen [PSA]: Secondary | ICD-10-CM

## 2023-08-04 NOTE — Telephone Encounter (Signed)
-----   Message from Bertram Millard Dahlstedt sent at 08/03/2023 12:52 PM EST ----- Please call patient-his PSA is still elevated at 4.4.  I would recommend ultrasound and biopsy.  I put order in. ----- Message ----- From: Interface, [facility] Lab Results In Sent: 07/28/2023   3:36 PM EST To: Marcine Matar, MD

## 2023-08-04 NOTE — Telephone Encounter (Signed)
Patient did not want to schedule biopsy at this time.  I scheduled a lab and office visit in 3 months to discuss further at that time.

## 2023-09-07 ENCOUNTER — Other Ambulatory Visit: Payer: Self-pay | Admitting: Urology

## 2023-09-07 DIAGNOSIS — R972 Elevated prostate specific antigen [PSA]: Secondary | ICD-10-CM

## 2023-09-07 MED ORDER — LEVOFLOXACIN 750 MG PO TABS: 750.0000 mg | ORAL_TABLET | Freq: Every day | ORAL | 0 refills | Status: AC

## 2023-09-29 ENCOUNTER — Telehealth: Payer: Self-pay

## 2023-09-29 NOTE — Telephone Encounter (Signed)
-----   Message from [facility] Kourtney B sent at 09/17/2023  4:50 PM EST ----- Can you please get pt set up for prostate biopsy with Dr. Matilda ----- Message ----- From: Matilda Senior, MD Sent: 09/07/2023  12:11 PM EST To: Veleria JONELLE Silversmith, CMA  Please call pt--we got old psa records from [facility]. His PSA in mid 2023 was 4.3, now is 5.5. I would suggest TRUS/Bx as discussed. I put orders in. ----- Message ----- From: McAdoo, Norma J Sent: 07/31/2023  10:05 AM EST To: Senior Matilda, MD

## 2023-09-29 NOTE — Telephone Encounter (Signed)
LVM to call back and have biopsy scheduled with MD Dahlstedt

## 2023-10-05 ENCOUNTER — Other Ambulatory Visit: Payer: Self-pay

## 2023-10-05 DIAGNOSIS — R972 Elevated prostate specific antigen [PSA]: Secondary | ICD-10-CM

## 2023-10-12 ENCOUNTER — Encounter (INDEPENDENT_AMBULATORY_CARE_PROVIDER_SITE_OTHER): Payer: Self-pay | Admitting: *Deleted

## 2023-10-26 ENCOUNTER — Telehealth: Payer: Self-pay

## 2023-10-26 NOTE — Telephone Encounter (Signed)
 Patient called to ask if his prostate biopsy was the same procedure as a colonoscopy.  Patient was educated on the prostate biopsy procedure and voiced understanding.  He will keep follow up as scheduled and call our office back with any questions or concerns.

## 2023-11-02 NOTE — Progress Notes (Signed)
 Keith Braun is here today for ultrasound and biopsy of his prostate.  Past 2 PSA draws have been 5.5 and 4.4.  At the age of 67, I have recommended ultrasound and biopsy.  Risks, benefits, and some of the potential complications of a transrectal ultrasounds of the prostate (TRUSP) with biopsies were discussed at length with the patient including gross hematuria, blood in the bowel movements, hematospermia, bacteremia, infection, voiding discomfort, urinary retention, fever, chills, sepsis, blood transfusion, death, and others. All questions were answered. Informed consent was obtained. The patient confirmed that he had taken his pre-procedure antibiotic. All anticoagulants were discontinued prior to the procedure. The patient emptied his bladder. He was positioned in a comfortable left lateral decubitus position with hips and knees acutely flexed.  The rectal probe was inserted into the rectum without difficulty. 10cc of 2% Lidocaine without epinephrine was instilled with a spinal needle using ultrasound guidance near the junction of each seminal vesicle and the prostate.  Sequential transverse (axial) scans were made in small increments beginning at the seminal vesicles and ending at the prostatic apex. Sequential longitudinal (saggital) scans were made in small increments beginning at the right lateral prostate and ending at the left lateral prostate. Excellent anatomical imaging was obtained. The peripheral, transitional, and central zones were well-defined. The seminal vesicles were normal.  Prostate volume 32 ml.  There were no hypoechoic areas. 12 needle core biopsies were performed. 1 biopsy each was taken from the following areas:  Right lateral base, right medial base, right lateral mid prostate, right medial mid prostate, right lateral apical prostate, right medial apical prostate, left lateral base, left medial base, left lateral mid prostate, left medial mid prostate, left lateral apical  prostate, left medial apical prostate.. Minimal prostatic calcifications were noted. Excellent biopsy specimens were obtained.  Follow-up rectal examination was unremarkable. The procedure was well-tolerated and without complications. Antibiotic instructions were given. The patient was told that:  For several days:  he should increase his fluid intake and limit strenuous activity  he might have mild discomfort at the base of his penis or in his rectum  he might have blood in his urine or blood in his bowel movements  For 2-3 months:  he might have blood in his ejaculate (semen)  Instructions were given to call the office immedicately for blood clots in the urine or bowel movements, difficulty urinating, inability to urinate, urinary retention, painful or frequent urination, fever, chills, nausea, vomiting, or other illness. The patient stated that he understood these instructions and would comply with them. We told the patient that prostate biopsy pathology reports are usually available within 3-5 working days, unless a pathologic second opinion is required, which may take 7-14 days. We told him to contact us to check on the status of his biopsy if he has not heard from [place] within 7 days. The patient left the ultrasound examination room in stable condition.

## 2023-11-03 ENCOUNTER — Other Ambulatory Visit: Payer: Self-pay | Admitting: Urology

## 2023-11-03 ENCOUNTER — Ambulatory Visit (HOSPITAL_COMMUNITY)
Admission: RE | Admit: 2023-11-03 | Discharge: 2023-11-03 | Disposition: A | Discharge status: Home / Self Care | Payer: BC Managed Care – PPO | Source: Ambulatory Visit | Attending: Urology | Admitting: Urology

## 2023-11-03 ENCOUNTER — Ambulatory Visit: Payer: BC Managed Care – PPO | Admitting: Urology

## 2023-11-03 ENCOUNTER — Encounter (HOSPITAL_COMMUNITY): Payer: Self-pay

## 2023-11-03 DIAGNOSIS — R972 Elevated prostate specific antigen [PSA]: Secondary | ICD-10-CM

## 2023-11-03 DIAGNOSIS — C61 Malignant neoplasm of prostate: Secondary | ICD-10-CM

## 2023-11-03 MED ORDER — LIDOCAINE HCL (PF) 2 % IJ SOLN
INTRAMUSCULAR | Status: AC
  Filled 2023-11-03: qty 10

## 2023-11-03 MED ORDER — GENTAMICIN SULFATE 40 MG/ML IJ SOLN
160.0000 mg | Freq: Once | INTRAMUSCULAR | Status: AC
  Administered 2023-11-03: 160 mg via INTRAMUSCULAR

## 2023-11-03 MED ORDER — LIDOCAINE HCL (PF) 2 % IJ SOLN
10.0000 mL | Freq: Once | INTRAMUSCULAR | Status: AC
  Administered 2023-11-03: 10 mL

## 2023-11-03 MED ORDER — GENTAMICIN SULFATE 40 MG/ML IJ SOLN
INTRAMUSCULAR | Status: AC
  Filled 2023-11-03: qty 4

## 2023-11-03 NOTE — Progress Notes (Signed)
 PT tolerated prostate biopsy procedure and antibiotic injection well today. Labs obtained and sent for pathology by Colette from ultrasound. PT ambulatory at discharge with no acute distress noted and verbalized understanding of discharge instructions.

## 2023-11-04 LAB — SURGICAL PATHOLOGY

## 2023-11-10 ENCOUNTER — Other Ambulatory Visit: Payer: BC Managed Care – PPO

## 2023-11-13 ENCOUNTER — Other Ambulatory Visit

## 2023-11-13 DIAGNOSIS — R972 Elevated prostate specific antigen [PSA]: Secondary | ICD-10-CM | POA: Diagnosis not present

## 2023-11-14 LAB — PSA: Prostate Specific Ag, Serum: 17 ng/mL — ABNORMAL HIGH (ref 0.0–4.0)

## 2023-11-16 NOTE — Progress Notes (Signed)
 History of Present Illness: Here to discuss PCa mgmt.  3.11.2025: TRUS/Bx. PSA 5.5, prostate volume 32 mL, PSAD 0.17.  2 cores (Lt base lateral, Rt apex medial) revealed GG 1 and GG 2 pattern in 10 and 20% of cores, respectively.  No past medical history on file.  Past Surgical History:  Procedure Laterality Date   COLONOSCOPY N/A 11/03/2013   Procedure: COLONOSCOPY;  Surgeon: Malissa Hippo, MD;  [facility];  Service: Endoscopy;  Laterality: N/A;  830    Home Medications:  Allergies as of 11/17/2023   No Known Allergies      Medication List        Accurate as of November 16, 2023  3:46 PM. If you have any questions, ask your nurse or doctor.          allopurinol 300 MG tablet Commonly known as: ZYLOPRIM Take 1 tablet (300 mg total) by mouth daily.   metFORMIN 500 MG tablet Commonly known as: GLUCOPHAGE Take 500 mg by mouth daily.   naproxen sodium 220 MG tablet Commonly known as: ALEVE Take 220 mg by mouth daily as needed.        Allergies: No Known Allergies  No family history on file.  Social History:  reports that he has been smoking. He has never used smokeless tobacco. He reports current alcohol use. He reports that he does not use drugs.  ROS: A complete review of systems was performed.  All systems are negative except for pertinent findings as noted.  Physical Exam:  Vital signs in last 24 hours: There were no vitals taken for this visit. Constitutional:  Alert and oriented, No acute distress Cardiovascular: Regular rate  Respiratory: Normal respiratory effort GI: Abdomen is soft, nontender, nondistended, no abdominal masses. No CVAT.  Genitourinary: Normal male phallus, testes are descended bilaterally and non-tender and without masses, scrotum is normal in appearance without lesions or masses, perineum is normal on inspection. Lymphatic: No lymphadenopathy Neurologic: Grossly intact, no focal deficits Psychiatric: Normal mood and  affect  I have reviewed prior pt notes  I have reviewed urinalysis results   I have reviewed recent PSA and pathology results  I have reviewed recent prostate U/S   Impression/Assessment:  ***  Plan:  ***

## 2023-11-17 ENCOUNTER — Ambulatory Visit: Payer: BC Managed Care – PPO | Admitting: Urology

## 2023-11-17 VITALS — BP 125/85 | HR 74

## 2023-11-17 DIAGNOSIS — C61 Malignant neoplasm of prostate: Secondary | ICD-10-CM | POA: Diagnosis not present

## 2023-11-18 LAB — URINALYSIS, ROUTINE W REFLEX MICROSCOPIC
Bilirubin, UA: NEGATIVE
Glucose, UA: NEGATIVE
Ketones, UA: NEGATIVE
Nitrite, UA: NEGATIVE
Protein,UA: NEGATIVE
Specific Gravity, UA: 1.02 (ref 1.005–1.030)
Urobilinogen, Ur: 1 mg/dL (ref 0.2–1.0)
pH, UA: 6 (ref 5.0–7.5)

## 2023-11-18 LAB — MICROSCOPIC EXAMINATION: Bacteria, UA: NONE SEEN

## 2023-12-14 ENCOUNTER — Telehealth: Payer: Self-pay

## 2023-12-14 NOTE — Telephone Encounter (Signed)
 Patient called and would like to move forward with brachytherapy.  Please provide surgery sheet and I will create referral and work on scheduling with patient.

## 2023-12-29 ENCOUNTER — Other Ambulatory Visit: Payer: Self-pay

## 2023-12-29 DIAGNOSIS — C61 Malignant neoplasm of prostate: Secondary | ICD-10-CM

## 2024-01-04 ENCOUNTER — Other Ambulatory Visit: Payer: Self-pay | Admitting: Urology

## 2024-01-04 DIAGNOSIS — C61 Malignant neoplasm of prostate: Secondary | ICD-10-CM

## 2024-01-05 NOTE — Progress Notes (Signed)
 GU Location of Tumor / Histology:  Prostate Ca  If Prostate Cancer, Gleason Score is (3 + 4) and PSA is (17.0 on 11/13/2023)  PSA 4.4 on 07/28/2023  Keith Braun presented as referral from Dr. [name] [facility] [place]).  Biopsies     Past/Anticipated interventions by urology, if any:  11/17/2023 Dr. [name]   Past/Anticipated interventions by medical oncology, if any:  NA  Weight changes, if any:   IPSS: SHIM:  Bowel/Bladder complaints, if any: {:18581}   Nausea/Vomiting, if any: {:18581}  Pain issues, if any:  {:18581}  SAFETY ISSUES: Prior radiation? {:18581} Pacemaker/ICD? {:18581} Possible current pregnancy? Male Is the patient on methotrexate? No  Current Complaints / other details:

## 2024-01-08 ENCOUNTER — Ambulatory Visit
Admission: RE | Admit: 2024-01-08 | Discharge: 2024-01-08 | Disposition: A | Discharge status: Home / Self Care | Source: Ambulatory Visit | Attending: Radiation Oncology | Admitting: Radiation Oncology

## 2024-01-08 ENCOUNTER — Telehealth: Payer: Self-pay | Admitting: *Deleted

## 2024-01-08 ENCOUNTER — Encounter: Payer: Self-pay | Admitting: Radiation Oncology

## 2024-01-08 VITALS — BP 144/100 | HR 78 | Temp 97.7°F | Resp 18 | Ht 71.0 in | Wt 197.8 lb

## 2024-01-08 DIAGNOSIS — Z191 Hormone sensitive malignancy status: Secondary | ICD-10-CM | POA: Diagnosis not present

## 2024-01-08 DIAGNOSIS — Z79899 Other long term (current) drug therapy: Secondary | ICD-10-CM | POA: Diagnosis not present

## 2024-01-08 DIAGNOSIS — E119 Type 2 diabetes mellitus without complications: Secondary | ICD-10-CM | POA: Insufficient documentation

## 2024-01-08 DIAGNOSIS — C61 Malignant neoplasm of prostate: Secondary | ICD-10-CM | POA: Insufficient documentation

## 2024-01-08 DIAGNOSIS — Z7984 Long term (current) use of oral hypoglycemic drugs: Secondary | ICD-10-CM | POA: Insufficient documentation

## 2024-01-08 DIAGNOSIS — M109 Gout, unspecified: Secondary | ICD-10-CM | POA: Diagnosis not present

## 2024-01-08 DIAGNOSIS — F17210 Nicotine dependence, cigarettes, uncomplicated: Secondary | ICD-10-CM | POA: Diagnosis not present

## 2024-01-08 HISTORY — DX: Type 2 diabetes mellitus without complications: E11.9

## 2024-01-08 HISTORY — DX: Malignant neoplasm of prostate: C61

## 2024-01-08 HISTORY — DX: Gout, unspecified: M10.9

## 2024-01-08 NOTE — Progress Notes (Signed)
 Radiation Oncology         [phone] ________________________________  Initial Outpatient Consultation  Name: Keith Braun MRN: [id number]  Date: 01/08/2024  DOB: Jul 10, 1961  QM:VHQIONG, Keith Smart, PA-C  DahlstedtMara Seminole, MD   REFERRING PHYSICIAN: [name], MD  DIAGNOSIS: 63 y.o. gentleman with Stage T1c adenocarcinoma of the prostate with Gleason score of 3+4, and PSA of 4.4.    ICD-10-CM   1. Malignant neoplasm of prostate (HCC)  C61       HISTORY OF PRESENT ILLNESS: [name] is a 63 y.o. male with a diagnosis of prostate cancer. He was noted to have an elevated PSA of 5.5 by his primary care provider, Keith Reading, PA-C.  Accordingly, he was referred for evaluation in urology by [name] on 07/28/23,  digital rectal examination performed at that time showed no nodules or induration. A repeat PSA obtained that day showed a drop but persistent elevation at 4.4. The patient proceeded to transrectal ultrasound with 12 biopsies of the prostate on 11/03/23.  The prostate volume measured 32 cc.  Out of 12 core biopsies, 2 were positive.  The maximum Gleason score was 3+4, and this was seen in the right apex. Additionally, Gleason 3+3 was seen in the left base lateral.  The patient reviewed the biopsy results with his urologist and he has kindly been referred today for discussion of potential radiation treatment options.   PREVIOUS RADIATION THERAPY: No  PAST MEDICAL HISTORY:  Past Medical History:  Diagnosis Date   Diabetes mellitus without complication (HCC)    Gout    Prostate cancer (HCC)       PAST SURGICAL HISTORY: Past Surgical History:  Procedure Laterality Date   COLONOSCOPY N/A 11/03/2013   Procedure: COLONOSCOPY;  Surgeon: [name], MD;  [facility];  Service: Endoscopy;  Laterality: N/A;  830   PROSTATE BIOPSY      FAMILY HISTORY: History reviewed. No pertinent family history.  SOCIAL HISTORY:  Social History    Socioeconomic History   Marital status: Divorced    Spouse name: Not on file   Number of children: Not on file   Years of education: Not on file   Highest education level: Not on file  Occupational History   Not on file  Tobacco Use   Smoking status: Every Day   Smokeless tobacco: Never   Tobacco comments:    1 pack a week x 30 yrs.   Vaping Use   Vaping status: Never Used  Substance and Sexual Activity   Alcohol use: Yes    Comment: 1/5 liquor a week   Drug use: No   Sexual activity: Not on file  Other Topics Concern   Not on file  Social History Narrative   Not on file   Social Drivers of Health   Financial Resource Strain: Not on file  Food Insecurity: No Food Insecurity (01/08/2024)   Hunger Vital Sign    Worried About Running Out of Food in the Last Year: Never true    Ran Out of Food in the Last Year: Never true  Transportation Needs: No Transportation Needs (01/08/2024)   PRAPARE - Administrator, Civil Service (Medical): No    Lack of Transportation (Non-Medical): No  Physical Activity: Not on file  Stress: Not on file  Social Connections: Not on file  Intimate Partner Violence: Not At Risk (01/08/2024)   Humiliation, Afraid, Rape, and Kick questionnaire    Fear of  Current or Ex-Partner: No    Emotionally Abused: No    Physically Abused: No    Sexually Abused: No    ALLERGIES: Patient has no known allergies.  MEDICATIONS:  Current Outpatient Medications  Medication Sig Dispense Refill   acetaminophen (TYLENOL) 650 MG CR tablet Take 650 mg by mouth every 8 (eight) hours as needed for pain.     allopurinol  (ZYLOPRIM ) 300 MG tablet Take 1 tablet (300 mg total) by mouth daily. 30 tablet 5   tadalafil (CIALIS) 20 MG tablet Take by mouth.     metFORMIN (GLUCOPHAGE) 500 MG tablet Take 500 mg by mouth daily. (Patient not taking: Reported on 11/17/2023)     naproxen sodium (ALEVE) 220 MG tablet Take 220 mg by mouth daily as needed. (Patient not  taking: Reported on 07/28/2023)     No current facility-administered medications for this encounter.    REVIEW OF SYSTEMS:  On review of systems, the patient reports that he is doing well overall. He denies any chest pain, shortness of breath, cough, fevers, chills, night sweats, unintended weight changes. He denies any bowel disturbances, and denies abdominal pain, nausea or vomiting. He denies any new musculoskeletal or joint aches or pains. His IPSS was 9, indicating mild urinary symptoms. His SHIM was 15, indicating he has mild-moderate erectile dysfunction. A complete review of systems is obtained and is otherwise negative.    PHYSICAL EXAM:  Wt Readings from Last 3 Encounters:  01/08/24 197 lb 12.8 oz (89.7 kg)  05/30/21 189 lb (85.7 kg)  05/02/21 189 lb 4 oz (85.8 kg)   Temp Readings from Last 3 Encounters:  01/08/24 97.7 F (36.5 C)  11/03/23 98.2 F (36.8 C) (Oral)  11/03/13 97.8 F (36.6 C) (Oral)   BP Readings from Last 3 Encounters:  01/08/24 (!) 144/100  11/17/23 125/85  11/03/23 113/73   Pulse Readings from Last 3 Encounters:  01/08/24 78  11/17/23 74  11/03/23 69    /10  In general this is a well appearing African American man in no acute distress. He's alert and oriented x4 and appropriate throughout the examination. Cardiopulmonary assessment is negative for acute distress, and he exhibits normal effort.     KPS = 100  100 - Normal; no complaints; no evidence of disease. 90   - Able to carry on normal activity; minor signs or symptoms of disease. 80   - Normal activity with effort; some signs or symptoms of disease. 17   - Cares for self; unable to carry on normal activity or to do active work. 60   - Requires occasional assistance, but is able to care for most of his personal needs. 50   - Requires considerable assistance and frequent medical care. 40   - Disabled; requires special care and assistance. 30   - Severely disabled; hospital admission is  indicated although death not imminent. 20   - Very sick; hospital admission necessary; active supportive treatment necessary. 10   - Moribund; fatal processes progressing rapidly. 0     - Dead  Karnofsky DA, Abelmann WH, Craver LS and Burchenal JH [phone]) The use of the nitrogen mustards in the palliative treatment of carcinoma: with particular reference to bronchogenic carcinoma Cancer 1 634-56  LABORATORY DATA:  No results found for: "WBC", "HGB", "HCT", "MCV", "PLT" No results found for: "NA", "K", "CL", "CO2" No results found for: "ALT", "AST", "GGT", "ALKPHOS", "BILITOT"   RADIOGRAPHY: No results found.    IMPRESSION/PLAN: 1. 63 y.o. gentleman  with Stage T1c adenocarcinoma of the prostate with Gleason Score of 3+4, and PSA of 4.4. We discussed the patient's workup and outlined the nature of prostate cancer in this setting. The patient's T stage, Gleason's score, and PSA put him into the favorable intermediate risk group. Accordingly, he is eligible for a variety of potential treatment options including brachytherapy, 5.5 weeks of external radiation, or prostatectomy. We discussed the available radiation techniques, and focused on the details and logistics of delivery. We discussed and outlined the risks, benefits, short and long-term effects associated with radiotherapy and compared and contrasted these with prostatectomy. We discussed the role of SpaceOAR gel in reducing the rectal toxicity associated with radiotherapy.  He appears to have a good understanding of his disease and our treatment recommendations which are of curative intent.  He was encouraged to ask questions that were answered to his stated satisfaction.  At the conclusion of our conversation, the patient is interested in moving forward with brachytherapy and use of SpaceOAR gel to reduce rectal toxicity from radiotherapy.  We will share our discussion with [name] and move forward with scheduling his CT [facility] planning  appointment in the near future.  The patient met briefly with Darlin Ehrlich in our office who will be working closely with him to coordinate OR scheduling and pre and post procedure appointments.  We will contact the pharmaceutical rep to ensure that SpaceOAR is available at the time of procedure.  We enjoyed meeting him today and look forward to continuing to participate in his care.  We personally spent 70 minutes in this encounter including chart review, reviewing radiological studies, meeting face-to-face with the patient, entering orders, coordinating care and completing documentation.    Arta Bihari, PA-C    Kenith Payer, MD  [facility]  Radiation Oncology Direct Dial: [phone]  Fax: [phone] [email or web address]  Skype  LinkedIn   This document serves as a record of services personally performed by Kenith Payer, MD and Keitha Pata, PA-C. It was created on their behalf by Florance Hun, a trained medical scribe. The creation of this record is based on the scribe's personal observations and the provider's statements to them. This document has been checked and approved by the attending provider.

## 2024-01-08 NOTE — Telephone Encounter (Signed)
 CALLED PATIENT TO INFORM OF PRE-SEED APPTS. AND IMPLANT DATE, SPOKE WITH PATIENT AND HE IS AWARE OF THESE APPTS.

## 2024-01-11 DIAGNOSIS — Z191 Hormone sensitive malignancy status: Secondary | ICD-10-CM | POA: Diagnosis not present

## 2024-01-11 DIAGNOSIS — C61 Malignant neoplasm of prostate: Secondary | ICD-10-CM | POA: Diagnosis not present

## 2024-01-11 NOTE — Progress Notes (Signed)
 Introduced myself to the patient as the prostate nurse navigator.  No barriers to care identified at this time.  He is here to discuss his radiation treatment options.  I gave him my business card and asked him to call me with questions or concerns.  Verbalized understanding.  ?

## 2024-01-12 ENCOUNTER — Telehealth: Payer: Self-pay | Admitting: *Deleted

## 2024-01-12 NOTE — Telephone Encounter (Signed)
 CALLED PATIENT TO INFORM OF PRE-SEED APPTS, AND IMPLANT DATE, SPOKE WITH PATIENT AND HE IS AWARE OF THESE APPT. DATES AND TIMES

## 2024-01-22 ENCOUNTER — Other Ambulatory Visit: Payer: Self-pay

## 2024-01-22 MED ORDER — FLEET ENEMA RE ENEM
1.0000 | ENEMA | Freq: Once | RECTAL | 0 refills | Status: AC
Start: 2024-01-22 — End: 2024-01-22

## 2024-02-05 ENCOUNTER — Other Ambulatory Visit: Payer: Self-pay | Admitting: Urology

## 2024-02-05 DIAGNOSIS — C61 Malignant neoplasm of prostate: Secondary | ICD-10-CM

## 2024-02-08 ENCOUNTER — Telehealth: Payer: Self-pay

## 2024-02-08 NOTE — Telephone Encounter (Signed)
-----   Message from Homero Luster sent at 02/08/2024 10:43 AM EDT ----- Regarding: RE: Preop appt He needs a UA and a preop H&P so he will need to be seen. ----- Message ----- From: Dyke Glasser, CMA Sent: 02/08/2024  10:42 AM EDT To: Homero Luster, MD Subject: RE: Preop appt                                 Pt only needs UA correct ? ----- Message ----- From: Homero Luster, MD Sent: 02/05/2024   1:17 PM EDT To: Roselee Cong, LPN; Valene Gash T# Subject: Preop appt                                     Keith Braun is going to have a seed implant on 8/7 and I will be doing it for Dr. Baker Bon.   Please get the patient in with Isa Manuel a week or two prior for a preop visit with UA.     Thanks.

## 2024-02-08 NOTE — Telephone Encounter (Signed)
 Pt was called to schedule appt per MD wrenn for UA and a preop H&P lvm for pt to c/b mychart message was sent

## 2024-03-01 NOTE — Progress Notes (Signed)
 Pre-seed nursing interview for a diagnosis of Stage T1c adenocarcinoma of the prostate with Gleason score of 3+4, and PSA of 4.4  Patient identity verified x2.   Patient states issues as follows...  -Pain: Denies -Fatigue: Denies -Abdomen: Denies -Groin: Denies -Urinary: Denies -Bowels: Denies -Appetite: Good  Patient denies all other related issues at this time.  Meaningful use complete.  Urinary Management medication(s)- None Urology appointment date- 03/2024, with Dr. Matilda at [facility]  No vitals needed for this visit. Ht 5' 11 (1.803 m)   Wt 194 lb (88 kg)   BMI 27.06 kg/m   This concludes the interaction.  NURSE REMINDER: START 'PRE-SEED' EDUCATION VIDEO AT 4:25 mins.  This concludes the interaction.  Rosaline Minerva, LPN

## 2024-03-03 NOTE — Progress Notes (Signed)
 RN spoke with patient to review any questions or barriers prior to upcoming brachytherapy procedure scheduled for 03/31/24.  All questions answered, no additional needs at this time.

## 2024-03-07 NOTE — Progress Notes (Unsigned)
 Name: Keith Braun DOB: 11-Dec-1960 MRN: [id number]  History of Present Illness: [name] is a 63 y.o. male who presents today for follow up visit at [facility].  Relevant History includes: 1. Prostate cancer. - 11/03/2023: TRUS/Bx. PSA 5.5, prostate volume 32 mL, PSAD 0.17. 2 cores (Lt base lateral, Rt apex medial) revealed GG 1 and GG 2 pattern in 10 and 20% of cores, respectively. 2. Erectile dysfunction, mild.  PSA values: - 05/03/2022: 4.3 - 05/29/2023: 5.5 - 07/28/2023: 4.4 - 11/13/2023: 17.0  At last visit with Dr. Matilda on 11/17/2023: He elected to proceed with brachytherapy (currently scheduled for 03/31/2024 with Dr. Watt).  Since last visit: Saw Radiation Oncology (Dr. Patrcia) on 01/08/2024.  Today: He reports right groin pain which started after mowing the grass a few days ago. It seems most noticeable with certain movements or positions. He has history of chronic RLQ pain however today he denies abdominal pain. He denies any bulge sensation in the area of his pain; also denies redness, warmth, swelling, or rash.   He reports urinary frequency, nocturia x1, and occasional hesitancy. He denies urinary urgency, dysuria, gross hematuria, straining to void, or sensations of incomplete emptying. Reports strong urine stream.    Medications: Current Outpatient Medications  Medication Sig Dispense Refill   acetaminophen (TYLENOL) 500 MG tablet Take 500-1,000 mg by mouth every 6 (six) hours as needed for moderate pain (pain score 4-6).     metroNIDAZOLE  (FLAGYL ) 500 MG tablet Take 4 tablets (2,000 mg total) by mouth once for 1 dose. 4 tablet 0   tadalafil (CIALIS) 20 MG tablet Take 20 mg by mouth daily as needed for erectile dysfunction.     No current facility-administered medications for this visit.    Allergies: Allergies  Allergen Reactions   Tomato     Flares eczema     Past Medical History:  Diagnosis Date   Diabetes mellitus  without complication (HCC)    Gout    Prostate cancer [facility])    Past Surgical History:  Procedure Laterality Date   COLONOSCOPY N/A 11/03/2013   Procedure: COLONOSCOPY;  Surgeon: Claudis RAYMOND Rivet, MD;  [facility];  Service: Endoscopy;  Laterality: N/A;  830   PROSTATE BIOPSY     History reviewed. No pertinent family history. Social History   Socioeconomic History   Marital status: Divorced    Spouse name: Not on file   Number of children: Not on file   Years of education: Not on file   Highest education level: Not on file  Occupational History   Not on file  Tobacco Use   Smoking status: Every Day   Smokeless tobacco: Never   Tobacco comments:    1 pack a week x 30 yrs.   Vaping Use   Vaping status: Never Used  Substance and Sexual Activity   Alcohol use: Yes    Comment: 1/5 liquor a week   Drug use: No   Sexual activity: Not on file  Other Topics Concern   Not on file  Social History Narrative   Not on file   Social Drivers of Health   Financial Resource Strain: Not on file  Food Insecurity: No Food Insecurity (01/08/2024)   Hunger Vital Sign    Worried About Running Out of Food in the Last Year: Never true    Ran Out of Food in the Last Year: Never true  Transportation Needs: No Transportation Needs (01/08/2024)   PRAPARE -  Administrator, Civil Service (Medical): No    Lack of Transportation (Non-Medical): No  Physical Activity: Not on file  Stress: Not on file  Social Connections: Not on file  Intimate Partner Violence: Not At Risk (01/08/2024)   Humiliation, Afraid, Rape, and Kick questionnaire    Fear of Current or Ex-Partner: No    Emotionally Abused: No    Physically Abused: No    Sexually Abused: No    Review of Systems Constitutional: Patient denies any unintentional weight loss or change in strength lntegumentary: Patient denies any rashes or pruritus Cardiovascular: Patient denies chest pain or syncope Respiratory: Patient  denies shortness of breath Gastrointestinal: Patient denies nausea, vomiting, constipation, or diarrhea  Musculoskeletal: Patient denies muscle cramps or weakness Neurologic: Patient denies convulsions or seizures Allergic/Immunologic: Patient denies recent allergic reaction(s) Hematologic/Lymphatic: Patient denies bleeding tendencies Endocrine: Patient denies heat/cold intolerance  GU: As per HPI.  OBJECTIVE Vitals:   03/08/24 1601  BP: 124/82  Pulse: 74   There is no height or weight on file to calculate BMI.  Physical Examination Constitutional: No obvious distress; patient is non-toxic appearing  Cardiovascular: No visible lower extremity edema.  Respiratory: The patient does not have audible wheezing/stridor; respirations do not appear labored  Gastrointestinal: Abdomen non-distended Musculoskeletal: Normal ROM of UEs  Skin: No obvious rashes/open sores  Neurologic: CN 2-12 grossly intact Psychiatric: Answered questions appropriately with normal affect  Hematologic/Lymphatic/Immunologic: No obvious bruises or sites of spontaneous bleeding  Urine microscopy: 0-5 WBC/hpf, 3-10 RBC/hpf, 0 bacteria, trichomonas present   ASSESSMENT Malignant neoplasm of prostate (HCC) - Plan: Urinalysis, Routine w reflex microscopic  Elevated PSA - Plan: Urinalysis, Routine w reflex microscopic  Strain of muscle of right groin region  Trichomoniasis - Plan: metroNIDAZOLE  (FLAGYL ) 500 MG tablet  Preop examination  Will treat with Metronidazole  2 grams x1 dose for trichomoniasis found in today's urine.   For right groin strain advised rest, application of ice, and OTC analgesics PRN.  Patient verbalized understanding of and agreement with current plan. All questions were answered.  PLAN Advised the following: 1. Metronidazole  2 grams x1 dose.  2. Return for  brachytherapy as scheduled for 03/31/2024.  Orders Placed This Encounter  Procedures   Urinalysis, Routine w reflex  microscopic    It has been explained that the patient is to follow regularly with their PCP in addition to all other providers involved in their care and to follow instructions provided by these respective offices. Patient advised to contact urology clinic if any urologic-pertaining questions, concerns, new symptoms or problems arise in the interim period.  There are no Patient Instructions on file for this visit.  Electronically signed by:  Lauraine JAYSON Oz, FNP   03/08/24    4:30 PM

## 2024-03-08 ENCOUNTER — Telehealth: Payer: Self-pay | Admitting: *Deleted

## 2024-03-08 ENCOUNTER — Encounter: Payer: Self-pay | Admitting: Urology

## 2024-03-08 ENCOUNTER — Ambulatory Visit: Admitting: Urology

## 2024-03-08 VITALS — BP 124/82 | HR 74

## 2024-03-08 DIAGNOSIS — R972 Elevated prostate specific antigen [PSA]: Secondary | ICD-10-CM | POA: Diagnosis not present

## 2024-03-08 DIAGNOSIS — S39011A Strain of muscle, fascia and tendon of abdomen, initial encounter: Secondary | ICD-10-CM | POA: Diagnosis not present

## 2024-03-08 DIAGNOSIS — Z01818 Encounter for other preprocedural examination: Secondary | ICD-10-CM

## 2024-03-08 DIAGNOSIS — C61 Malignant neoplasm of prostate: Secondary | ICD-10-CM

## 2024-03-08 DIAGNOSIS — A599 Trichomoniasis, unspecified: Secondary | ICD-10-CM

## 2024-03-08 MED ORDER — METRONIDAZOLE 500 MG PO TABS: 2000.0000 mg | ORAL_TABLET | Freq: Once | ORAL | 0 refills | Status: AC

## 2024-03-08 NOTE — Telephone Encounter (Signed)
 Returned patient's phone call, spoke with patient

## 2024-03-09 LAB — URINALYSIS, ROUTINE W REFLEX MICROSCOPIC
Bilirubin, UA: NEGATIVE
Glucose, UA: NEGATIVE
Ketones, UA: NEGATIVE
Protein,UA: NEGATIVE
Specific Gravity, UA: 1.02 (ref 1.005–1.030)
Urobilinogen, Ur: 1 mg/dL (ref 0.2–1.0)
pH, UA: 6 (ref 5.0–7.5)

## 2024-03-09 LAB — MICROSCOPIC EXAMINATION: Bacteria, UA: NONE SEEN

## 2024-03-10 ENCOUNTER — Telehealth: Payer: Self-pay | Admitting: *Deleted

## 2024-03-10 NOTE — Patient Instructions (Addendum)
 SURGICAL WAITING ROOM VISITATION Patients having surgery or a procedure may have no more than 2 support people in the waiting area - these visitors may rotate in the visitor waiting room.   If the patient needs to stay at the hospital during part of their recovery, the visitor guidelines for inpatient rooms apply.  PRE-OP VISITATION  Pre-op nurse will coordinate an appropriate time for 1 support person to accompany the patient in pre-op.  This support person may not rotate.  This visitor will be contacted when the time is appropriate for the visitor to come back in the pre-op area.  Please refer to the [facility] website for the visitor guidelines for Inpatients (after your surgery is over and you are in a regular room).  You are not required to quarantine at this time prior to your surgery. However, you must do this: Hand Hygiene often Do NOT share personal items Notify your provider if you are in close contact with someone who has COVID or you develop fever 100.4 or greater, new onset of sneezing, cough, sore throat, shortness of breath or body aches.  If you test positive for Covid or have been in contact with anyone that has tested positive in the last 10 days please notify you surgeon.    Your procedure is scheduled on:  Thursday March 31, 2024  Report to [facility] Main Entrance: [address] entrance where the [employer] is available.   Report to admitting at: 10:45    AM  Call this number if you have any questions or problems the morning of surgery [phone]  DO NOT EAT OR DRINK ANYTHING AFTER MIDNIGHT THE NIGHT PRIOR TO YOUR SURGERY / PROCEDURE.   FOLLOW  ANY ADDITIONAL PRE OP INSTRUCTIONS YOU RECEIVED FROM YOUR SURGEON'S OFFICE!!!  FLEET ENEMA: Obtain one(1) Fleet Enema (sodium phosphate 7-19 gm / 118 ml enema) and use (according to the directions on the box) the morning of  your surgery.   If you have any questions, please contact your Surgeon's office for  additional information.    Oral Hygiene is also important to reduce your risk of infection.        Remember - BRUSH YOUR TEETH THE MORNING OF SURGERY WITH YOUR REGULAR TOOTHPASTE  Do NOT smoke after Midnight the night before surgery.  STOP TAKING all Vitamins, Herbs and supplements 1 week before your surgery.   Take ONLY these medicines the morning of surgery with A SIP OF WATER : Tylenol                    You may not have any metal on your body including jewelry, and body piercing  Do not wear lotions, powders,  cologne, or deodorant  Men may shave face and neck.  Contacts, Hearing Aids, dentures or bridgework may not be worn into surgery. DENTURES WILL BE REMOVED PRIOR TO SURGERY PLEASE DO NOT APPLY Poly grip OR ADHESIVES!!!   Patients discharged on the day of surgery will not be allowed to drive home.  Someone NEEDS to stay with you for the first 24 hours after anesthesia.  Do not bring your home medications to the hospital. The Pharmacy will dispense medications listed on your medication list to you during your admission in the Hospital.  Please read over the following fact sheets you were given: IF YOU HAVE QUESTIONS ABOUT YOUR PRE-OP INSTRUCTIONS, PLEASE CALL [phone].   [place] - Preparing for Surgery Before surgery, you can play an important role.  Because skin is not sterile, your skin needs to be as free of germs as possible.  You can reduce the number of germs on your skin by washing with CHG (chlorahexidine gluconate) soap before surgery.  CHG is an antiseptic cleaner which kills germs and bonds with the skin to continue killing germs even after washing. Please DO NOT use if you have an allergy to CHG or antibacterial soaps.  If your skin becomes reddened/irritated stop using the CHG and inform your nurse when you arrive at Short Stay. Do not shave (including legs and underarms) for at least 48 hours prior to the first CHG shower.  You may shave your  face/neck.  Please follow these instructions carefully:  1.  Shower with CHG Soap the night before surgery and the  morning of surgery.  2.  If you choose to wash your hair, wash your hair first as usual with your normal  shampoo.  3.  After you shampoo, rinse your hair and body thoroughly to remove the shampoo.                             4.  Use CHG as you would any other liquid soap.  You can apply chg directly to the skin and wash.  Gently with a scrungie or clean washcloth.  5.  Apply the CHG Soap to your body ONLY FROM THE NECK DOWN.   Do not use on face/ open                           Wound or open sores. Avoid contact with eyes, ears mouth and genitals (private parts).                       Wash face,  Genitals (private parts) with your normal soap.             6.  Wash thoroughly, paying special attention to the area where your  surgery  will be performed.  7.  Thoroughly rinse your body with warm water  from the neck down.  8.  DO NOT shower/wash with your normal soap after using and rinsing off the CHG Soap.            9.  Pat yourself dry with a clean towel.            10.  Wear clean pajamas.            11.  Place clean sheets on your bed the night of your first shower and do not  sleep with pets.  ON THE DAY OF SURGERY : Do not apply any lotions/deodorants the morning of surgery.  Please wear clean clothes to the hospital/surgery center.    FAILURE TO FOLLOW THESE INSTRUCTIONS MAY RESULT IN THE CANCELLATION OF YOUR SURGERY  PATIENT SIGNATURE_________________________________  NURSE SIGNATURE__________________________________  ________________________________________________________________________

## 2024-03-10 NOTE — Telephone Encounter (Signed)
 CALLED PATIENT TO REMIND OF PRE-SEED APPTS. FOR 03-11-24, LVM FOR A RETURN CALL

## 2024-03-10 NOTE — Progress Notes (Signed)
 COVID Vaccine received:  []  No [x]  Yes Date of any COVID positive Test in last 90 days:   none  PCP - Lucie Mace, PA-C Cardiologist - none  Chest x-ray -  EKG - 03-11-24  Stress Test -  ECHO -  Cardiac Cath -  CT Coronary Calcium score:   Bowel Prep - []  No  [x]   Yes _1 Fleet enema_  Pacemaker / ICD device [x]  No []  Yes   Spinal Cord Stimulator:[x]  No []  Yes       History of Sleep Apnea? [x]  No []  Yes   CPAP used?- [x]  No []  Yes    Does the patient monitor blood sugar?   []  N/A   [x]  No []  Yes  Patient has: []  NO Hx DM   [x]  Pre-DM   []  DM1  []   DM2 Last A1c was:        on       Blood Thinner / Instructions:  none Aspirin Instructions:  none  ERAS Protocol Ordered: [x]  No  []  Yes Patient is to be NPO after: MN Prior  Dental hx: []  Dentures:  [x]  N/A      []  Bridge or Partial:                   [x]  Loose or Damaged teeth:   Activity level: Able to walk up 2 flights of stairs without becoming significantly short of breath or having chest pain?  []  No   [x]    Yes   Anesthesia review: DM2- no meds, HTN- no meds, gout, smoker  Patient denies any S&S of respiratory illness or Covid - no shortness of breath, fever, cough or chest pain at PAT appointment.  Patient verbalized understanding and agreement to the Pre-Surgical Instructions that were given to them at this PAT appointment. Patient was also educated of the need to review these PAT instructions again prior to his surgery.I reviewed the appropriate phone numbers to call if they have any and questions or concerns.

## 2024-03-11 ENCOUNTER — Ambulatory Visit
Admission: RE | Admit: 2024-03-11 | Discharge: 2024-03-11 | Disposition: A | Discharge status: Home / Self Care | Source: Ambulatory Visit | Attending: Radiation Oncology | Admitting: Radiation Oncology

## 2024-03-11 ENCOUNTER — Encounter (HOSPITAL_COMMUNITY)
Admission: RE | Admit: 2024-03-11 | Discharge: 2024-03-11 | Disposition: A | Discharge status: Home / Self Care | Source: Ambulatory Visit | Attending: Urology | Admitting: Urology

## 2024-03-11 ENCOUNTER — Encounter: Payer: Self-pay | Admitting: Urology

## 2024-03-11 ENCOUNTER — Encounter (HOSPITAL_COMMUNITY): Payer: Self-pay

## 2024-03-11 ENCOUNTER — Ambulatory Visit
Admission: RE | Admit: 2024-03-11 | Discharge: 2024-03-11 | Disposition: A | Discharge status: Home / Self Care | Payer: Self-pay | Source: Ambulatory Visit | Attending: Urology | Admitting: Urology

## 2024-03-11 ENCOUNTER — Other Ambulatory Visit: Payer: Self-pay

## 2024-03-11 VITALS — BP 127/82 | HR 64 | Temp 97.7°F | Resp 14 | Ht 71.0 in | Wt 194.0 lb

## 2024-03-11 VITALS — Ht 71.0 in | Wt 192.0 lb

## 2024-03-11 DIAGNOSIS — C61 Malignant neoplasm of prostate: Secondary | ICD-10-CM | POA: Diagnosis not present

## 2024-03-11 DIAGNOSIS — I440 Atrioventricular block, first degree: Secondary | ICD-10-CM | POA: Diagnosis not present

## 2024-03-11 DIAGNOSIS — R001 Bradycardia, unspecified: Secondary | ICD-10-CM | POA: Diagnosis not present

## 2024-03-11 DIAGNOSIS — E119 Type 2 diabetes mellitus without complications: Secondary | ICD-10-CM | POA: Insufficient documentation

## 2024-03-11 DIAGNOSIS — Z01818 Encounter for other preprocedural examination: Secondary | ICD-10-CM | POA: Insufficient documentation

## 2024-03-11 DIAGNOSIS — I10 Essential (primary) hypertension: Secondary | ICD-10-CM | POA: Insufficient documentation

## 2024-03-11 DIAGNOSIS — Z191 Hormone sensitive malignancy status: Secondary | ICD-10-CM | POA: Diagnosis not present

## 2024-03-11 HISTORY — DX: Unspecified osteoarthritis, unspecified site: M19.90

## 2024-03-11 HISTORY — DX: Essential (primary) hypertension: I10

## 2024-03-11 LAB — BASIC METABOLIC PANEL WITH GFR
Anion gap: 9 (ref 5–15)
BUN: 17 mg/dL (ref 8–23)
CO2: 24 mmol/L (ref 22–32)
Calcium: 9.5 mg/dL (ref 8.9–10.3)
Chloride: 104 mmol/L (ref 98–111)
Creatinine, Ser: 1.16 mg/dL (ref 0.61–1.24)
GFR, Estimated: >60 mL/min (ref >60)
Glucose, Bld: 114 mg/dL — ABNORMAL HIGH (ref 70–99)
Potassium: 4.8 mmol/L (ref 3.5–5.1)
Sodium: 137 mmol/L (ref 135–145)

## 2024-03-11 LAB — CBC
HCT: 47.2 % (ref 39.0–52.0)
Hemoglobin: 14.7 g/dL (ref 13.0–17.0)
MCH: 27.8 pg (ref 26.0–34.0)
MCHC: 31.1 g/dL (ref 30.0–36.0)
MCV: 89.2 fL (ref 80.0–100.0)
Platelets: 203 K/uL (ref 150–400)
RBC: 5.29 MIL/uL (ref 4.22–5.81)
RDW: 14 % (ref 11.5–15.5)
WBC: 7.4 K/uL (ref 4.0–10.5)
nRBC: 0 % (ref 0.0–0.2)

## 2024-03-11 LAB — GLUCOSE, CAPILLARY: Glucose-Capillary: 108 mg/dL — ABNORMAL HIGH (ref 70–99)

## 2024-03-11 LAB — HEMOGLOBIN A1C
Hgb A1c MFr Bld: 5.8 % — ABNORMAL HIGH (ref 4.8–5.6)
Mean Plasma Glucose: 119.76 mg/dL

## 2024-03-11 NOTE — Progress Notes (Signed)
  Radiation Oncology         [phone] ________________________________  Name: Keith Braun MRN: [id number]  Date: 03/11/2024  DOB: Aug 10, 1961  SIMULATION AND TREATMENT PLANNING NOTE PUBIC ARCH STUDY Definitive Seed Implant as Monotherapy  RR:Gjrxdnw, [name], PA-C  Jackson, [name], [place]*  DIAGNOSIS: 63 y.o. gentleman with Stage T1c adenocarcinoma of the prostate with Gleason score of 3+4, and PSA of 4.4.   Oncology History  Malignant neoplasm of prostate (HCC)  11/03/2023 Cancer Staging   Staging form: Prostate, AJCC 8th Edition - Clinical stage from 11/03/2023: Stage IIB (cT1c, cN0, cM0, PSA: 4.4, Grade Group: 2) - Signed by [name], PA-C on 01/08/2024 Histopathologic type: Adenocarcinoma, NOS Stage prefix: Initial diagnosis Prostate specific antigen (PSA) range: Less than 10 Gleason primary pattern: 3 Gleason secondary pattern: 4 Gleason score: 7 Histologic grading system: 5 grade system Number of biopsy cores examined: 12 Number of biopsy cores positive: 2 Location of positive needle core biopsies: Both sides   01/08/2024 Initial Diagnosis   Malignant neoplasm of prostate (HCC)       ICD-10-CM   1. Malignant neoplasm of prostate (HCC)  C61       COMPLEX SIMULATION:  The patient presented today for evaluation for possible prostate seed implant. He was brought to the radiation planning suite and placed supine on the CT couch. A 3-dimensional image study set was obtained in upload to the planning computer. There, on each axial slice, I contoured the prostate gland. Then, using three-dimensional radiation planning tools I reconstructed the prostate in view of the structures from the transperineal needle pathway to assess for possible pubic arch interference. In doing so, I did not appreciate any pubic arch interference. Also, the patient's prostate volume was estimated based on the drawn structure. The volume was 33 cc.  Given the pubic arch appearance and  prostate volume, patient remains a good candidate to proceed with prostate seed implant. Today, he freely provided informed written consent to proceed.    PLAN: The patient will undergo prostate seed implant to 145 Gy.   ________________________________  Donnice LABOR. Patrcia, M.D.

## 2024-03-11 NOTE — Progress Notes (Signed)
 Radiation Oncology         [phone] ________________________________  Outpatient Follow up- Pre-seed visit  Name: Keith Braun MRN: [id number]  Date: 03/11/2024  DOB: May 03, 1961  RR:Gjrxdnw, Lucie PARAS, PA-C  DahlstedtGarnette, MD   REFERRING PHYSICIAN: Matilda Garnette, MD  DIAGNOSIS: 63 y.o. gentleman with  Stage T1c adenocarcinoma of the prostate with Gleason score of 3+4, and PSA of 4.4.     ICD-10-CM   1. Malignant neoplasm of prostate (HCC)  C61       HISTORY OF PRESENT ILLNESS: Keith Braun is a 63 y.o. male with a diagnosis of prostate cancer. He was noted to have an elevated PSA of 5.5 by his primary care provider, Lucie Mace, PA-C.  Accordingly, he was referred for evaluation in urology by Dr. Matilda on 07/28/23,  digital rectal examination performed at that time showed no nodules or induration. A repeat PSA obtained that day showed a drop but persistent elevation at 4.4. The patient proceeded to transrectal ultrasound with 12 biopsies of the prostate on 11/03/23.  The prostate volume measured 32 cc.  Out of 12 core biopsies, 2 were positive.  The maximum Gleason score was 3+4, and this was seen in the right apex. Additionally, Gleason 3+3 was seen in the left base lateral.   The patient reviewed the biopsy results with his urologist and was kindly referred to us  for discussion of potential radiation treatment options. We initially met the patient on 01/08/24 and he was most interested in proceeding with brachytherapy and SpaceOAR gel placement for treatment of his disease. He is here today for his pre-procedure imaging for planning and to answer any additional questions he may have about this treatment.   PREVIOUS RADIATION THERAPY: No  PAST MEDICAL HISTORY:  Past Medical History:  Diagnosis Date   Diabetes mellitus without complication (HCC)    Gout    Prostate cancer (HCC)       PAST SURGICAL HISTORY: Past Surgical History:  Procedure  Laterality Date   COLONOSCOPY N/A 11/03/2013   Procedure: COLONOSCOPY;  Surgeon: Claudis RAYMOND Rivet, MD;  [facility];  Service: Endoscopy;  Laterality: N/A;  830   PROSTATE BIOPSY      FAMILY HISTORY: History reviewed. No pertinent family history.  SOCIAL HISTORY:  Social History   Socioeconomic History   Marital status: Divorced    Spouse name: Not on file   Number of children: Not on file   Years of education: Not on file   Highest education level: Not on file  Occupational History   Not on file  Tobacco Use   Smoking status: Every Day   Smokeless tobacco: Never   Tobacco comments:    1 pack a week x 30 yrs.   Vaping Use   Vaping status: Never Used  Substance and Sexual Activity   Alcohol use: Yes    Comment: 1/5 liquor a week   Drug use: No   Sexual activity: Not on file  Other Topics Concern   Not on file  Social History Narrative   Not on file   Social Drivers of Health   Financial Resource Strain: Not on file  Food Insecurity: No Food Insecurity (03/11/2024)   Hunger Vital Sign    Worried About Running Out of Food in the Last Year: Never true    Ran Out of Food in the Last Year: Never true  Transportation Needs: No Transportation Needs (03/11/2024)   PRAPARE - Transportation  Lack of Transportation (Medical): No    Lack of Transportation (Non-Medical): No  Physical Activity: Not on file  Stress: Not on file  Social Connections: Not on file  Intimate Partner Violence: Not At Risk (03/11/2024)   Humiliation, Afraid, Rape, and Kick questionnaire    Fear of Current or Ex-Partner: No    Emotionally Abused: No    Physically Abused: No    Sexually Abused: No    ALLERGIES: Tomato  MEDICATIONS:  Current Outpatient Medications  Medication Sig Dispense Refill   acetaminophen (TYLENOL) 500 MG tablet Take 500-1,000 mg by mouth every 6 (six) hours as needed for moderate pain (pain score 4-6).     tadalafil (CIALIS) 20 MG tablet Take 20 mg by mouth  daily as needed for erectile dysfunction.     No current facility-administered medications for this encounter.    REVIEW OF SYSTEMS:  On review of systems, the patient reports that he is doing well overall. He denies any chest pain, shortness of breath, cough, fevers, chills, night sweats, unintended weight changes. He denies any bowel disturbances, and denies abdominal pain, nausea or vomiting. He denies any new musculoskeletal or joint aches or pains. His IPSS was 9, indicating mild urinary symptoms. His SHIM was 15, indicating he has mild-moderate erectile dysfunction. A complete review of systems is obtained and is otherwise negative.     PHYSICAL EXAM:  Wt Readings from Last 3 Encounters:  03/11/24 192 lb (87.1 kg)  01/08/24 197 lb 12.8 oz (89.7 kg)  05/30/21 189 lb (85.7 kg)   Temp Readings from Last 3 Encounters:  01/08/24 97.7 F (36.5 C)  11/03/23 98.2 F (36.8 C) (Oral)  11/03/13 97.8 F (36.6 C) (Oral)   BP Readings from Last 3 Encounters:  03/08/24 124/82  01/08/24 (!) 144/100  11/17/23 125/85   Pulse Readings from Last 3 Encounters:  03/08/24 74  01/08/24 78  11/17/23 74    /10  In general this is a well appearing African American male in no acute distress. He's alert and oriented x4 and appropriate throughout the examination. Cardiopulmonary assessment is negative for acute distress, and he exhibits normal effort.     KPS = 100  100 - Normal; no complaints; no evidence of disease. 90   - Able to carry on normal activity; minor signs or symptoms of disease. 80   - Normal activity with effort; some signs or symptoms of disease. 71   - Cares for self; unable to carry on normal activity or to do active work. 60   - Requires occasional assistance, but is able to care for most of his personal needs. 50   - Requires considerable assistance and frequent medical care. 40   - Disabled; requires special care and assistance. 30   - Severely disabled; hospital admission  is indicated although death not imminent. 20   - Very sick; hospital admission necessary; active supportive treatment necessary. 10   - Moribund; fatal processes progressing rapidly. 0     - Dead  Karnofsky DA, Abelmann WH, Craver LS and Burchenal JH [phone]) The use of the nitrogen mustards in the palliative treatment of carcinoma: with particular reference to bronchogenic carcinoma Cancer 1 [phone]  LABORATORY DATA:  No results found for: WBC, HGB, HCT, MCV, PLT No results found for: NA, K, CL, CO2 No results found for: ALT, AST, GGT, ALKPHOS, BILITOT   RADIOGRAPHY: No results found.    IMPRESSION/PLAN: 1. 63 y.o. gentleman with  Stage T1c adenocarcinoma of the  prostate with Gleason score of 3+4, and PSA of 4.4.  The patient has elected to proceed with seed implant for treatment of his disease. We reviewed the risks, benefits, short and long-term effects associated with brachytherapy and discussed the role of SpaceOAR in reducing the rectal toxicity associated with radiotherapy.  He appears to have a good understanding of his disease and our treatment recommendations which are of curative intent.  He was encouraged to ask questions that were answered to his stated satisfaction. He has freely signed written consent to proceed today in the office and a copy of this document will be placed in his medical record. His procedure is tentatively scheduled for 03/31/24 in collaboration with Dr. Watt and we will see him back for his post-procedure visit approximately 3 weeks thereafter. We look forward to continuing to participate in his care. He knows that he is welcome to call with any questions or concerns at any time in the interim.  I personally spent 30 minutes in this encounter including chart review, reviewing radiological studies, meeting face-to-face with the patient, entering orders and completing documentation.    Sabra MICAEL Rusk, MMS, PA-C [place]  [facility] Radiation Oncology Physician Assistant Direct Dial: [phone]  Fax: [phone]

## 2024-03-30 ENCOUNTER — Telehealth: Payer: Self-pay | Admitting: *Deleted

## 2024-03-30 NOTE — H&P (Signed)
 Name: Keith Braun DOB: 11/01/1960 MRN: [id number]  History of Present Illness: Mr. Memoli is a 63 y.o. male who presents today for follow up visit at [facility].  Relevant History includes: 1. Prostate cancer. - 11/03/2023: TRUS/Bx. PSA 5.5, prostate volume 32 mL, PSAD 0.17. 2 cores (Lt base lateral, Rt apex medial) revealed GG 1 and GG 2 pattern in 10 and 20% of cores, respectively. 2. Erectile dysfunction, mild.  PSA values: - 05/03/2022: 4.3 - 05/29/2023: 5.5 - 07/28/2023: 4.4 - 11/13/2023: 17.0  At last visit with Dr. Matilda on 11/17/2023: He elected to proceed with brachytherapy (currently scheduled for 03/31/2024 with Dr. Watt).  Since last visit: Saw Radiation Oncology (Dr. Patrcia) on 01/08/2024.  Today: He reports right groin pain which started after mowing the grass a few days ago. It seems most noticeable with certain movements or positions. He has history of chronic RLQ pain however today he denies abdominal pain. He denies any bulge sensation in the area of his pain; also denies redness, warmth, swelling, or rash.   He reports urinary frequency, nocturia x1, and occasional hesitancy. He denies urinary urgency, dysuria, gross hematuria, straining to void, or sensations of incomplete emptying. Reports strong urine stream.    Medications: No current facility-administered medications for this encounter.   Current Outpatient Medications  Medication Sig Dispense Refill   acetaminophen  (TYLENOL ) 500 MG tablet Take 500-1,000 mg by mouth every 6 (six) hours as needed for moderate pain (pain score 4-6).     tadalafil (CIALIS) 20 MG tablet Take 20 mg by mouth daily as needed for erectile dysfunction.      Allergies: Allergies  Allergen Reactions   Tomato     Flares eczema     Past Medical History:  Diagnosis Date   Arthritis    Gout   Diabetes mellitus without complication (HCC)    Gout    Hypertension    no meds   Prostate cancer  [facility])    Past Surgical History:  Procedure Laterality Date   COLONOSCOPY N/A 11/03/2013   Procedure: COLONOSCOPY;  Surgeon: Claudis RAYMOND Rivet, MD;  Location: [facility];  Service: Endoscopy;  Laterality: N/A;  830   PROSTATE BIOPSY     WISDOM TOOTH EXTRACTION     No family history on file. Social History   Socioeconomic History   Marital status: Divorced    Spouse name: Not on file   Number of children: Not on file   Years of education: Not on file   Highest education level: Not on file  Occupational History   Not on file  Tobacco Use   Smoking status: Every Day    Types: Cigarettes, Cigars   Smokeless tobacco: Never   Tobacco comments:    1 pack a week x 30 yrs.   Vaping Use   Vaping status: Never Used  Substance and Sexual Activity   Alcohol use: Yes    Comment: 1/5 liquor a week   Drug use: No   Sexual activity: Not Currently  Other Topics Concern   Not on file  Social History Narrative   Not on file   Social Drivers of Health   Financial Resource Strain: Not on file  Food Insecurity: No Food Insecurity (03/11/2024)   Hunger Vital Sign    Worried About Running Out of Food in the Last Year: Never true    Ran Out of Food in the Last Year: Never true  Transportation Needs: No Transportation Needs (03/11/2024)  PRAPARE - Administrator, Civil Service (Medical): No    Lack of Transportation (Non-Medical): No  Physical Activity: Not on file  Stress: Not on file  Social Connections: Not on file  Intimate Partner Violence: Not At Risk (03/11/2024)   Humiliation, Afraid, Rape, and Kick questionnaire    Fear of Current or Ex-Partner: No    Emotionally Abused: No    Physically Abused: No    Sexually Abused: No    Review of Systems Constitutional: Patient denies any unintentional weight loss or change in strength lntegumentary: Patient denies any rashes or pruritus Cardiovascular: Patient denies chest pain or syncope Respiratory: Patient denies  shortness of breath Gastrointestinal: Patient denies nausea, vomiting, constipation, or diarrhea  Musculoskeletal: Patient denies muscle cramps or weakness Neurologic: Patient denies convulsions or seizures Allergic/Immunologic: Patient denies recent allergic reaction(s) Hematologic/Lymphatic: Patient denies bleeding tendencies Endocrine: Patient denies heat/cold intolerance  GU: As per HPI.  OBJECTIVE There were no vitals filed for this visit.  There is no height or weight on file to calculate BMI.  Physical Examination Constitutional: No obvious distress; patient is non-toxic appearing  Cardiovascular: No visible lower extremity edema.  Respiratory: The patient does not have audible wheezing/stridor; respirations do not appear labored  Gastrointestinal: Abdomen non-distended Musculoskeletal: Normal ROM of UEs  Skin: No obvious rashes/open sores  Neurologic: CN 2-12 grossly intact Psychiatric: Answered questions appropriately with normal affect  Hematologic/Lymphatic/Immunologic: No obvious bruises or sites of spontaneous bleeding  Urine microscopy: 0-5 WBC/hpf, 3-10 RBC/hpf, 0 bacteria, trichomonas present   ASSESSMENT No diagnosis found.  Will treat with Metronidazole  2 grams x1 dose for trichomoniasis found in today's urine.   For right groin strain advised rest, application of ice, and OTC analgesics PRN.  Patient verbalized understanding of and agreement with current plan. All questions were answered.  PLAN Advised the following: 1. Metronidazole  2 grams x1 dose.  2. No follow-ups on file.  No orders of the defined types were placed in this encounter.   It has been explained that the patient is to follow regularly with their PCP in addition to all other providers involved in their care and to follow instructions provided by these respective offices. Patient advised to contact urology clinic if any urologic-pertaining questions, concerns, new symptoms or problems  arise in the interim period.  There are no outpatient Patient Instructions on file for this admission.  Electronically signed by:  Norleen Seltzer, MD   03/30/24    4:15 PM

## 2024-03-30 NOTE — Telephone Encounter (Signed)
 CALLED PATIENT TO REMIND OF PROCEDURE, LVM FOR A RETURN CALL

## 2024-03-31 ENCOUNTER — Encounter (HOSPITAL_COMMUNITY): Payer: Self-pay | Admitting: Urology

## 2024-03-31 ENCOUNTER — Ambulatory Visit (HOSPITAL_COMMUNITY): Payer: Self-pay | Admitting: Anesthesiology

## 2024-03-31 ENCOUNTER — Ambulatory Visit (HOSPITAL_COMMUNITY)
Admission: RE | Admit: 2024-03-31 | Discharge: 2024-03-31 | Disposition: A | Discharge status: Home / Self Care | Source: Ambulatory Visit | Attending: Urology | Admitting: Urology

## 2024-03-31 ENCOUNTER — Ambulatory Visit (HOSPITAL_COMMUNITY)

## 2024-03-31 ENCOUNTER — Encounter (HOSPITAL_COMMUNITY)
Admission: RE | Disposition: A | Discharge status: Home / Self Care | Payer: Self-pay | Source: Ambulatory Visit | Attending: Urology

## 2024-03-31 DIAGNOSIS — E119 Type 2 diabetes mellitus without complications: Secondary | ICD-10-CM | POA: Diagnosis not present

## 2024-03-31 DIAGNOSIS — I10 Essential (primary) hypertension: Secondary | ICD-10-CM | POA: Diagnosis not present

## 2024-03-31 DIAGNOSIS — C61 Malignant neoplasm of prostate: Secondary | ICD-10-CM | POA: Diagnosis not present

## 2024-03-31 DIAGNOSIS — F17210 Nicotine dependence, cigarettes, uncomplicated: Secondary | ICD-10-CM | POA: Diagnosis not present

## 2024-03-31 DIAGNOSIS — A599 Trichomoniasis, unspecified: Secondary | ICD-10-CM | POA: Insufficient documentation

## 2024-03-31 DIAGNOSIS — Z01818 Encounter for other preprocedural examination: Secondary | ICD-10-CM

## 2024-03-31 DIAGNOSIS — N529 Male erectile dysfunction, unspecified: Secondary | ICD-10-CM | POA: Insufficient documentation

## 2024-03-31 HISTORY — PX: RADIOACTIVE SEED IMPLANT: SHX5150

## 2024-03-31 LAB — GLUCOSE, CAPILLARY: Glucose-Capillary: 106 mg/dL — ABNORMAL HIGH (ref 70–99)

## 2024-03-31 SURGERY — INSERTION, RADIATION SOURCE, PROSTATE
Anesthesia: General

## 2024-03-31 MED ORDER — OXYCODONE HCL 5 MG PO TABS
5.0000 mg | ORAL_TABLET | Freq: Once | ORAL | Status: AC | PRN
  Administered 2024-03-31: 5 mg via ORAL

## 2024-03-31 MED ORDER — CIPROFLOXACIN IN D5W 400 MG/200ML IV SOLN
400.0000 mg | INTRAVENOUS | Status: AC
  Administered 2024-03-31: 400 mg via INTRAVENOUS
  Filled 2024-03-31: qty 200

## 2024-03-31 MED ORDER — LACTATED RINGERS IV SOLN: INTRAVENOUS | Status: DC

## 2024-03-31 MED ORDER — STERILE WATER FOR IRRIGATION IR SOLN
Status: DC | PRN
  Administered 2024-03-31: 500 mL

## 2024-03-31 MED ORDER — ORAL CARE MOUTH RINSE: 15.0000 mL | Freq: Once | OROMUCOSAL | Status: DC

## 2024-03-31 MED ORDER — HYDROCODONE-ACETAMINOPHEN 5-325 MG PO TABS: 1.0000 | ORAL_TABLET | Freq: Four times a day (QID) | ORAL | 0 refills | Status: AC | PRN

## 2024-03-31 MED ORDER — PHENYLEPHRINE HCL (PRESSORS) 10 MG/ML IV SOLN
INTRAVENOUS | Status: DC | PRN
Start: 2024-03-31 — End: 2024-03-31
  Administered 2024-03-31: 100 ug via INTRAVENOUS
  Administered 2024-03-31: 80 ug via INTRAVENOUS
  Administered 2024-03-31: 40 ug via INTRAVENOUS
  Administered 2024-03-31: 80 ug via INTRAVENOUS

## 2024-03-31 MED ORDER — CHLORHEXIDINE GLUCONATE 0.12 % MT SOLN: 15.0000 mL | Freq: Once | OROMUCOSAL | Status: DC

## 2024-03-31 MED ORDER — FENTANYL CITRATE (PF) 100 MCG/2ML IJ SOLN
INTRAMUSCULAR | Status: DC | PRN
  Administered 2024-03-31 (×2): 50 ug via INTRAVENOUS
  Administered 2024-03-31 (×2): 25 ug via INTRAVENOUS

## 2024-03-31 MED ORDER — SODIUM CHLORIDE 0.9 % IR SOLN
Status: DC | PRN
Start: 2024-03-31 — End: 2024-03-31
  Administered 2024-03-31: 1000 mL via INTRAVESICAL

## 2024-03-31 MED ORDER — OXYCODONE HCL 5 MG/5ML PO SOLN: 5.0000 mg | Freq: Once | ORAL | Status: AC | PRN

## 2024-03-31 MED ORDER — ONDANSETRON HCL 4 MG/2ML IJ SOLN: 4.0000 mg | Freq: Once | INTRAMUSCULAR | Status: DC | PRN

## 2024-03-31 MED ORDER — SODIUM CHLORIDE (PF) 0.9 % IJ SOLN
INTRAMUSCULAR | Status: DC | PRN
  Administered 2024-03-31: 10 mL

## 2024-03-31 MED ORDER — EPHEDRINE SULFATE (PRESSORS) 50 MG/ML IJ SOLN
INTRAMUSCULAR | Status: DC | PRN
  Administered 2024-03-31: 5 mg via INTRAVENOUS

## 2024-03-31 MED ORDER — SUGAMMADEX SODIUM 200 MG/2ML IV SOLN
INTRAVENOUS | Status: AC
  Filled 2024-03-31: qty 4

## 2024-03-31 MED ORDER — OXYCODONE HCL 5 MG PO TABS
ORAL_TABLET | ORAL | Status: AC
  Filled 2024-03-31: qty 1

## 2024-03-31 MED ORDER — FLEET ENEMA RE ENEM
1.0000 | ENEMA | Freq: Once | RECTAL | Status: DC
  Filled 2024-03-31: qty 1

## 2024-03-31 MED ORDER — MIDAZOLAM HCL 2 MG/2ML IJ SOLN
INTRAMUSCULAR | Status: AC
Start: 2024-03-31 — End: 2024-03-31
  Filled 2024-03-31: qty 2

## 2024-03-31 MED ORDER — ROCURONIUM BROMIDE 10 MG/ML (PF) SYRINGE
PREFILLED_SYRINGE | INTRAVENOUS | Status: AC
  Filled 2024-03-31: qty 10

## 2024-03-31 MED ORDER — FENTANYL CITRATE PF 50 MCG/ML IJ SOSY: 25.0000 ug | PREFILLED_SYRINGE | INTRAMUSCULAR | Status: DC | PRN

## 2024-03-31 MED ORDER — FENTANYL CITRATE (PF) 250 MCG/5ML IJ SOLN
INTRAMUSCULAR | Status: AC
  Filled 2024-03-31: qty 5

## 2024-03-31 MED ORDER — ROCURONIUM BROMIDE 100 MG/10ML IV SOLN
INTRAVENOUS | Status: DC | PRN
  Administered 2024-03-31: 20 mg via INTRAVENOUS
  Administered 2024-03-31: 50 mg via INTRAVENOUS

## 2024-03-31 MED ORDER — ONDANSETRON HCL 4 MG/2ML IJ SOLN
INTRAMUSCULAR | Status: AC
  Filled 2024-03-31: qty 2

## 2024-03-31 MED ORDER — LIDOCAINE HCL (PF) 2 % IJ SOLN
INTRAMUSCULAR | Status: AC
  Filled 2024-03-31: qty 5

## 2024-03-31 MED ORDER — PROPOFOL 10 MG/ML IV BOLUS
INTRAVENOUS | Status: AC
  Filled 2024-03-31: qty 20

## 2024-03-31 MED ORDER — EPHEDRINE 5 MG/ML INJ
INTRAVENOUS | Status: AC
  Filled 2024-03-31: qty 5

## 2024-03-31 MED ORDER — MEPERIDINE HCL 25 MG/ML IJ SOLN: 6.2500 mg | INTRAMUSCULAR | Status: DC | PRN

## 2024-03-31 MED ORDER — ONDANSETRON HCL 4 MG/2ML IJ SOLN
INTRAMUSCULAR | Status: DC | PRN
  Administered 2024-03-31: 4 mg via INTRAVENOUS

## 2024-03-31 MED ORDER — IOHEXOL 300 MG/ML  SOLN
INTRAMUSCULAR | Status: DC | PRN
  Administered 2024-03-31: 5 mL

## 2024-03-31 MED ORDER — SODIUM CHLORIDE (PF) 0.9 % IJ SOLN
INTRAMUSCULAR | Status: AC
  Filled 2024-03-31: qty 10

## 2024-03-31 MED ORDER — PHENYLEPHRINE 80 MCG/ML (10ML) SYRINGE FOR IV PUSH (FOR BLOOD PRESSURE SUPPORT)
PREFILLED_SYRINGE | INTRAVENOUS | Status: AC
  Filled 2024-03-31: qty 10

## 2024-03-31 MED ORDER — PROPOFOL 10 MG/ML IV BOLUS
INTRAVENOUS | Status: DC | PRN
  Administered 2024-03-31: 170 mg via INTRAVENOUS

## 2024-03-31 MED ORDER — DEXAMETHASONE SODIUM PHOSPHATE 10 MG/ML IJ SOLN
INTRAMUSCULAR | Status: AC
  Filled 2024-03-31: qty 1

## 2024-03-31 MED ORDER — LIDOCAINE HCL (CARDIAC) PF 100 MG/5ML IV SOSY
PREFILLED_SYRINGE | INTRAVENOUS | Status: DC | PRN
  Administered 2024-03-31: 100 mg via INTRAVENOUS

## 2024-03-31 MED ORDER — DEXAMETHASONE SODIUM PHOSPHATE 10 MG/ML IJ SOLN
INTRAMUSCULAR | Status: DC | PRN
  Administered 2024-03-31: 4 mg via INTRAVENOUS

## 2024-03-31 MED ORDER — MIDAZOLAM HCL 5 MG/5ML IJ SOLN
INTRAMUSCULAR | Status: DC | PRN
  Administered 2024-03-31 (×2): 1 mg via INTRAVENOUS

## 2024-03-31 MED ORDER — SODIUM CHLORIDE 0.9% FLUSH: 3.0000 mL | Freq: Two times a day (BID) | INTRAVENOUS | Status: DC

## 2024-03-31 MED ORDER — SUGAMMADEX SODIUM 200 MG/2ML IV SOLN
INTRAVENOUS | Status: DC | PRN
  Administered 2024-03-31: 300 mg via INTRAVENOUS

## 2024-03-31 SURGICAL SUPPLY — 28 items
BAG URINE DRAIN 2000ML AR STRL (UROLOGICAL SUPPLIES) ×1 IMPLANT
BLADE CLIPPER SURG (BLADE) ×1 IMPLANT
Bard QuickLink Cartridges with BrachySource I-125 IMPLANT
CATH ROBINSON RED A/P 20FR (CATHETERS) ×1 IMPLANT
COVER BACK TABLE 60X90IN (DRAPES) ×1 IMPLANT
COVER MAYO STAND STRL (DRAPES) ×1 IMPLANT
DRAPE SURG IRRIG POUCH 19X23 (DRAPES) ×1 IMPLANT
DRSG TEGADERM 8X12 (GAUZE/BANDAGES/DRESSINGS) ×1 IMPLANT
GLOVE SURG SS PI 8.0 STRL IVOR (GLOVE) IMPLANT
GOWN STRL REUS W/ TWL XL LVL3 (GOWN DISPOSABLE) ×1 IMPLANT
GOWN STRL SURGICAL XL XLNG (GOWN DISPOSABLE) ×1 IMPLANT
GRID BRACH TEMP 18GA 2.8X3X.75 (MISCELLANEOUS) ×1 IMPLANT
HOLDER FOLEY CATH W/STRAP (MISCELLANEOUS) ×1 IMPLANT
IMPL SPACEOAR SYSTEM 10ML (Spacer) ×1 IMPLANT
KIT TURNOVER KIT A (KITS) ×1 IMPLANT
MARKER SKIN DUAL TIP RULER LAB (MISCELLANEOUS) ×1 IMPLANT
NDL BRACHY 18G 5PK (NEEDLE) ×4 IMPLANT
NDL BRACHY 18G SINGLE (NEEDLE) IMPLANT
NDL PK MORGANSTERN STABILIZ (NEEDLE) ×1 IMPLANT
NEEDLE BRACHY 18G 5PK (NEEDLE) ×4 IMPLANT
NEEDLE BRACHY 18G SINGLE (NEEDLE) IMPLANT
NEEDLE PK MORGANSTERN STABILIZ (NEEDLE) ×1 IMPLANT
PACK CYSTO (CUSTOM PROCEDURE TRAY) ×1 IMPLANT
PREP POVIDONE IODINE SPRAY 2OZ (MISCELLANEOUS) ×1 IMPLANT
SYR 10ML LL (SYRINGE) IMPLANT
TOWEL OR 17X26 10 PK STRL BLUE (TOWEL DISPOSABLE) ×1 IMPLANT
TRAY FOLEY MTR SLVR 16FR STAT (SET/KITS/TRAYS/PACK) ×1 IMPLANT
UNDERPAD 30X36 HEAVY ABSORB (UNDERPADS AND DIAPERS) ×1 IMPLANT

## 2024-03-31 NOTE — Anesthesia Postprocedure Evaluation (Signed)
 Anesthesia Post Note  Patient: Keith Braun  Procedure(s) Performed: INSERTION, RADIATION SOURCE, PROSTATE     Patient location during evaluation: PACU Anesthesia Type: General Level of consciousness: awake and alert Pain management: pain level controlled Vital Signs Assessment: post-procedure vital signs reviewed and stable Respiratory status: spontaneous breathing, nonlabored ventilation, respiratory function stable and patient connected to nasal cannula oxygen Cardiovascular status: blood pressure returned to baseline and stable Postop Assessment: no apparent nausea or vomiting Anesthetic complications: no   No notable events documented.  Last Vitals:  Vitals:   03/31/24 1530 03/31/24 1548  BP: 122/89 132/88  Pulse: (!) 57 (!) 59  Resp: 16   Temp:    SpO2: 98% 100%    Last Pain:  Vitals:   03/31/24 1548  TempSrc:   PainSc: 0-No pain                 Garnette DELENA Gab

## 2024-03-31 NOTE — Op Note (Signed)
 PATIENT:  [name]  PRE-OPERATIVE DIAGNOSIS:  Adenocarcinoma of the prostate  POST-OPERATIVE DIAGNOSIS:  Same  PROCEDURE:  Procedure(s): 1. I-125 radioactive seed implantation 2. SpaceOAR implantation. 3.  Cystoscopy  SURGEON:  Surgeon(s): Norleen Seltzer MD  Radiation oncologist: Dr. Donnice Barge  ANESTHESIA:  General  EBL:  Minimal  DRAINS: 16 French Foley catheter  INDICATION: Keith Braun is a 63 y.o. with Stage T1c, Gleason 7(3+4) prostate cancer who has elected brachytherapy for treatment.  Description of procedure: After informed consent the patient was brought to the major OR, placed on the table and administered general anesthesia. He was then moved to the modified lithotomy position with his perineum perpendicular to the floor. His perineum and genitalia were then sterilely prepped. An official timeout was then performed. A 16 French Foley catheter was then placed in the bladder and filled with dilute contrast, a rectal tube was placed in the rectum and the transrectal ultrasound probe was placed in the rectum and affixed to the stand. He was then sterilely draped.  The sterile grid was installed.   Anchor needles were then placed.   Real time ultrasonography was used along with the seed planning software spot-pro version 3.1-00. This was used to develop the seed plan including the number of needles as well as number of seeds required for complete and adequate coverage. Real-time ultrasonography was then used along with the previously developed plan  to implant a total of 63 seeds using 19 needles for a target dose of 145 Gy. This proceeded without difficulty or complication.  The anchor needles and guide were removed and the SpaceOAR needle was passed under US  guidance into the fat stripe posterior to the prostate with the tip in the midline at mid prostate. A puff of NS confirmed appropriate positioning and the SpaceOAR polymer was then injected over 10 seconds into  the space with excellent distribution.     A Foley catheter was then removed as well as the transrectal ultrasound probe and rectal probe. Flexible cystoscopy was then performed using the 17 French flexible scope which revealed a normal urethra throughout its length down to the sphincter which appeared intact. The prostatic urethra was short with lateral lobe hyperplasia. . The bladder was then entered and fully and systematically inspected.  The ureteral orifices were noted to be of normal configuration and position. The mucosa revealed no evidence of tumors. There were also no stones identified within the bladder.  NO seeds or spacers were seen and/or removed from the bladder.  The cystoscope was then removed.  The drapes were removed.  The perineum was cleaned and dressed.  He was taken out of the lithotomy position and was awakened and taken to recovery room in stable and satisfactory condition. He tolerated procedure well and there were no intraoperative complications.

## 2024-03-31 NOTE — Interval H&P Note (Signed)
 History and Physical Interval Note:  03/31/2024 12:40 PM  Keith Braun  has presented today for surgery, with the diagnosis of prostate cancer.  The various methods of treatment have been discussed with the patient and family. After consideration of risks, benefits and other options for treatment, the patient has consented to  Procedure(s): INSERTION, RADIATION SOURCE, PROSTATE (N/A) as a surgical intervention.  The patient's history has been reviewed, patient examined, no change in status, stable for surgery.  I have reviewed the patient's chart and labs.  Questions were answered to the patient's satisfaction.     Torre Schaumburg

## 2024-03-31 NOTE — Anesthesia Preprocedure Evaluation (Addendum)
 Anesthesia Evaluation  Patient identified by MRN, date of birth, ID band Patient awake    Reviewed: Allergy & Precautions, H&P , NPO status , Patient's Chart, lab work & pertinent test results  Airway Mallampati: II  TM Distance: >3 FB Neck ROM: Full    Dental no notable dental hx.    Pulmonary neg pulmonary ROS, Current Smoker   Pulmonary exam normal breath sounds clear to auscultation       Cardiovascular Exercise Tolerance: Good hypertension, negative cardio ROS Normal cardiovascular exam Rhythm:Regular Rate:Normal     Neuro/Psych negative neurological ROS  negative psych ROS   GI/Hepatic negative GI ROS, Neg liver ROS,,,  Endo/Other  negative endocrine ROSdiabetes    Renal/GU negative Renal ROS  negative genitourinary   Musculoskeletal negative musculoskeletal ROS (+)    Abdominal   Peds negative pediatric ROS (+)  Hematology negative hematology ROS (+)   Anesthesia Other Findings   Reproductive/Obstetrics negative OB ROS                              Anesthesia Physical Anesthesia Plan  ASA: 2  Anesthesia Plan: General   Post-op Pain Management: Tylenol  PO (pre-op)* and Celebrex PO (pre-op)*   Induction: Intravenous  PONV Risk Score and Plan: 2 and Ondansetron , Dexamethasone  and Treatment may vary due to age or medical condition  Airway Management Planned: Oral ETT and LMA  Additional Equipment: None  Intra-op Plan:   Post-operative Plan: Extubation in OR  Informed Consent: I have reviewed the patients History and Physical, chart, labs and discussed the procedure including the risks, benefits and alternatives for the proposed anesthesia with the patient or authorized representative who has indicated his/her understanding and acceptance.     Dental advisory given  Plan Discussed with: Anesthesiologist and CRNA  Anesthesia Plan Comments:          Anesthesia  Quick Evaluation

## 2024-03-31 NOTE — Anesthesia Procedure Notes (Signed)
 Procedure Name: Intubation Date/Time: 03/31/2024 1:27 PM  Performed by: Kathern Rollene LABOR, CRNAPre-anesthesia Checklist: Patient identified, Emergency Drugs available, Suction available and Patient being monitored Patient Re-evaluated:Patient Re-evaluated prior to induction Oxygen Delivery Method: Circle system utilized Preoxygenation: Pre-oxygenation with 100% oxygen Induction Type: IV induction Ventilation: Mask ventilation without difficulty Laryngoscope Size: Mac and 4 Grade View: Grade II Tube type: Oral Tube size: 7.5 mm Number of attempts: 1 Airway Equipment and Method: Stylet and Oral airway Placement Confirmation: ETT inserted through vocal cords under direct vision, positive ETCO2 and breath sounds checked- equal and bilateral Secured at: 23 cm Tube secured with: Tape Dental Injury: Teeth and Oropharynx as per pre-operative assessment

## 2024-03-31 NOTE — Transfer of Care (Signed)
 Immediate Anesthesia Transfer of Care Note  Patient: Keith Braun  Procedure(s) Performed: INSERTION, RADIATION SOURCE, PROSTATE  Patient Location: PACU  Anesthesia Type:General  Level of Consciousness: awake, alert , and oriented  Airway & Oxygen Therapy: Patient Spontanous Breathing and Patient connected to face mask oxygen  Post-op Assessment: Report given to RN and Post -op Vital signs reviewed and stable  Post vital signs: Reviewed and stable  Last Vitals:  Vitals Value Taken Time  BP 115/77 03/31/24 15:00  Temp    Pulse 70 03/31/24 15:01  Resp 13 03/31/24 15:01  SpO2 99 % 03/31/24 15:01  Vitals shown include unfiled device data.  Last Pain:  Vitals:   03/31/24 1054  TempSrc:   PainSc: 0-No pain         Complications: No notable events documented.

## 2024-04-01 ENCOUNTER — Other Ambulatory Visit: Payer: Self-pay | Admitting: Urology

## 2024-04-01 ENCOUNTER — Encounter (HOSPITAL_COMMUNITY): Payer: Self-pay | Admitting: Urology

## 2024-04-01 DIAGNOSIS — C61 Malignant neoplasm of prostate: Secondary | ICD-10-CM

## 2024-04-03 NOTE — Progress Notes (Signed)
 Radiation Oncology         [phone] ________________________________  Name: Keith Braun MRN: [id number]  Date: 04/03/2024  DOB: [birth year]       Prostate Seed Implant  RR:Gjrxdnw, Lucie PARAS, PA-C  No ref. provider found  DIAGNOSIS:  63 y.o. gentleman with Stage T1c adenocarcinoma of the prostate with Gleason score of 3+4, and PSA of 4.4.   Oncology History  Malignant neoplasm of prostate (HCC)  11/03/2023 Cancer Staging   Staging form: Prostate, AJCC 8th Edition - Clinical stage from 11/03/2023: Stage IIB (cT1c, cN0, cM0, PSA: 4.4, Grade Group: 2) - Signed by [name], PA-C on 01/08/2024 Histopathologic type: Adenocarcinoma, NOS Stage prefix: Initial diagnosis Prostate specific antigen (PSA) range: Less than 10 Gleason primary pattern: 3 Gleason secondary pattern: 4 Gleason score: 7 Histologic grading system: 5 grade system Number of biopsy cores examined: 12 Number of biopsy cores positive: 2 Location of positive needle core biopsies: Both sides   01/08/2024 Initial Diagnosis   Malignant neoplasm of prostate (HCC)       ICD-10-CM   1. Pre-op testing  Z01.818 CANCELED: CBG per Guidelines for Diabetes Management for Patients Undergoing Surgery ([facility], [facility], and [facility] only)    CANCELED: CBG per Guidelines for Diabetes Management for Patients Undergoing Surgery ([facility], [facility], and [facility] only)    2. Type 2 diabetes mellitus treated without insulin (HCC)  E11.9 CANCELED: CBG per Guidelines for Diabetes Management for Patients Undergoing Surgery ([facility], [facility], and [facility] only)    CANCELED: CBG per Guidelines for Diabetes Management for Patients Undergoing Surgery ([facility], [facility], and [facility] only)      PROCEDURE: Insertion of radioactive I-125 seeds into the prostate gland.  RADIATION DOSE: 145 Gy, definitive therapy.  TECHNIQUE: DELMAS FAUCETT was brought to the operating room with the urologist. He was placed in the dorsolithotomy position. He was catheterized and a rectal tube was inserted. The  perineum was shaved, prepped and draped. The ultrasound probe was then introduced by me into the rectum to see the prostate gland.  TREATMENT DEVICE: I attached the needle grid to the ultrasound probe stand and anchor needles were placed.  3D PLANNING: The prostate was imaged in 3D using a sagittal sweep of the prostate probe. These images were transferred to the planning computer. There, the prostate, urethra and rectum were defined on each axial reconstructed image. Then, the software created an optimized 3D plan and a few seed positions were adjusted. The quality of the plan was reviewed using [facility] information for the target and the following two organs at risk:  Urethra and Rectum.  Then the accepted plan was printed and handed off to the radiation therapist.  Under my supervision, the custom loading of the seeds and spacers was carried out using the quick loader.  These pre-loaded needles were then placed into the needle holder.SABRA  PROSTATE VOLUME STUDY:  Using transrectal ultrasound the volume of the prostate was verified to be 33.6 cc.  SPECIAL TREATMENT PROCEDURE/SUPERVISION AND HANDLING: The pre-loaded needles were then delivered by the urologist under sagittal guidance. A total of 19 needles were used to deposit 63 seeds in the prostate gland. The individual seed activity was 0.395 mCi.  SpaceOAR:  Yes  COMPLEX SIMULATION: At the end of the procedure, an anterior radiograph of the pelvis was obtained to document seed positioning and count. Cystoscopy was performed by the urologist to check the urethra and bladder.  MICRODOSIMETRY: At the end of the procedure, the patient was emitting  0.074 mR/hr at 1 meter. Accordingly, he was considered safe for hospital discharge.  PLAN: The patient will return to the radiation oncology clinic for post implant CT dosimetry in three weeks.   ________________________________  [name], M.D.

## 2024-04-04 ENCOUNTER — Telehealth: Payer: Self-pay

## 2024-04-04 NOTE — Telephone Encounter (Signed)
 Pt called in the office regarding FMLA. He wanted some information on how to start the process. I let him know that he has to get into touch with his HR. Pt verbalized understanding.

## 2024-04-05 NOTE — Progress Notes (Signed)
 Patient was a RadOnc Consult on 01/08/24 for his stage T1c adenocarcinoma of the prostate with Gleason score of 3+4, and PSA of 4.4.  Patient proceed with treatment recommendations of brachytherapy and had his treatment on 03/31/24.   Patient is scheduled for a post seed CT Sim on 8/28 and a post op urology appointment on 04/29/24.

## 2024-04-12 ENCOUNTER — Telehealth: Payer: Self-pay | Admitting: *Deleted

## 2024-04-12 NOTE — Telephone Encounter (Signed)
 Keith Braun, [phone]).  Need to file FMLA.  Please return call.  Connected with Keith Braun.  I have been out of work since 04/08/2024.  When and where he may deliver forms.  Do I have to wait for form to be completed and signed?   Reviewed [facility] Form Policy.  Do not wait, we ask to allow 7-10 business days (14-calendar) to process forms.  In order of receipt.  Provided office numbers, operation hours for delivery Mon. - Fri. From 9:00 am - 5:00 pm..  When here there is a sheet to provide information to complete form(s) correctly and an authorization form for use and disclosure of PHI to the named third party. Denies further questions or needs.

## 2024-04-15 ENCOUNTER — Encounter: Payer: Self-pay | Admitting: Radiology

## 2024-04-19 ENCOUNTER — Telehealth: Payer: Self-pay | Admitting: *Deleted

## 2024-04-19 NOTE — Telephone Encounter (Signed)
 CALLED PATIENT TO REMIND OF MRI AND POST SEED APPTS. FOR 04-21-24, SPOKE WITH PATIENT AND HE IS AWARE OF THESE APPTS.

## 2024-04-19 NOTE — Progress Notes (Signed)
  Radiation Oncology         [phone] ________________________________  Name: Keith Braun MRN: [id number]  Date: 04/21/2024  DOB: Mar 23, 1961  COMPLEX SIMULATION NOTE  NARRATIVE:  The patient was brought to the CT Simulation planning suite today following prostate seed implantation approximately one month ago.  Identity was confirmed.  All relevant records and images related to the planned course of therapy were reviewed.  Then, the patient was set-up supine.  CT images were obtained.  The CT images were loaded into the planning software.  Then the prostate and rectum were contoured.  Treatment planning then occurred.  The implanted iodine 125 seeds were identified by the physics staff for projection of radiation distribution  I have requested : 3D Simulation  I have requested a DVH of the following structures: Prostate and rectum.    ________________________________  [name], M.D.

## 2024-04-21 ENCOUNTER — Ambulatory Visit (HOSPITAL_COMMUNITY)
Admission: RE | Admit: 2024-04-21 | Discharge: 2024-04-21 | Disposition: A | Discharge status: Home / Self Care | Source: Ambulatory Visit | Attending: Urology | Admitting: Urology

## 2024-04-21 ENCOUNTER — Ambulatory Visit
Admission: RE | Admit: 2024-04-21 | Discharge: 2024-04-21 | Discharge status: Home / Self Care | Source: Ambulatory Visit | Attending: Radiation Oncology | Admitting: Radiation Oncology

## 2024-04-21 ENCOUNTER — Ambulatory Visit
Admission: RE | Admit: 2024-04-21 | Discharge: 2024-04-21 | Disposition: A | Discharge status: Home / Self Care | Payer: Self-pay | Source: Ambulatory Visit | Attending: Urology | Admitting: Urology

## 2024-04-21 ENCOUNTER — Encounter: Payer: Self-pay | Admitting: Radiation Oncology

## 2024-04-21 VITALS — BP 116/80 | HR 60 | Temp 96.6°F | Resp 18 | Wt 195.8 lb

## 2024-04-21 DIAGNOSIS — C61 Malignant neoplasm of prostate: Secondary | ICD-10-CM | POA: Insufficient documentation

## 2024-04-21 NOTE — Progress Notes (Signed)
 Radiation Oncology         [phone] ________________________________  Name: Keith Braun MRN: [id number]  Date: 04/21/2024  DOB: August 09, 1961  Post-Seed Follow-Up Visit Note  CC: Leonce Lucie JINNY DEVONNA Matilda Garnette, MD  Diagnosis:    63 y.o. gentleman with  Stage T1c adenocarcinoma of the prostate with Gleason score of 3+4, and PSA of 4.4.     ICD-10-CM   1. Malignant neoplasm of prostate (HCC)  C61       Interval Since Last Radiation:  3 weeks 03/31/24:  Insertion of radioactive I-125 seeds into the prostate gland; 145 Gy, definitive therapy with placement of SpaceOAR gel.  Narrative:  The patient returns today for routine follow-up.  He is complaining of increased urinary frequency and urinary hesitation symptoms. He filled out a questionnaire regarding urinary function today providing and overall IPSS score of 13 characterizing his symptoms as moderate with urgency, frequency, weaker flow of stream and feelings of incomplete emptying..  His pre-implant score was 9. He denies any abdominal pain or bowel symptoms. He reports a healthy appetite and is maintaining his weight. His energy level is good and he has been able to remain active. Overall, he is quite pleased with his progress to date.  ALLERGIES:  is allergic to tomato.  Meds: Current Outpatient Medications  Medication Sig Dispense Refill   acetaminophen  (TYLENOL ) 500 MG tablet Take 500-1,000 mg by mouth every 6 (six) hours as needed for moderate pain (pain score 4-6).     HYDROcodone -acetaminophen  (NORCO/VICODIN) 5-325 MG tablet Take 1 tablet by mouth every 6 (six) hours as needed for moderate pain (pain score 4-6). 6 tablet 0   tadalafil (CIALIS) 20 MG tablet Take 20 mg by mouth daily as needed for erectile dysfunction.     No current facility-administered medications for this visit.    Physical Findings: In general this is a well appearing African American male in no acute distress. He's alert and  oriented x4 and appropriate throughout the examination. Cardiopulmonary assessment is negative for acute distress and he exhibits normal effort.   Lab Findings: Lab Results  Component Value Date   WBC 7.4 03/11/2024   HGB 14.7 03/11/2024   HCT 47.2 03/11/2024   MCV 89.2 03/11/2024   PLT 203 03/11/2024    Radiographic Findings:  Patient underwent CT imaging in our clinic for post implant dosimetry. The CT will be fused with his prostate MRI that was performed earlier today and will be reviewed by Dr. Patrcia to confirm there is an adequate distribution of radioactive seeds throughout the prostate gland and ensure that there are no seeds in or near the rectum. We suspect the final radiation plan and dosimetry will show appropriate coverage of the prostate gland. He understands that we will call and inform him of any unexpected findings on further review of his imaging and dosimetry.  Impression/Plan: The patient is recovering from the effects of radiation. His urinary symptoms should gradually improve over the next 4-6 months. We talked about this today. He is encouraged by his improvement already and is otherwise pleased with his outcome. We also talked about long-term follow-up for prostate cancer following seed implant. He understands that ongoing PSA determinations and digital rectal exams will help perform surveillance to rule out disease recurrence. He has a follow up appointment scheduled with Dr. Watt on 04/29/24 and will see Dr. Matilda in 06/2024 with his initial post-treatment PSA prior to that visit. He understands what to expect with his  PSA measures. Patient was also educated today about some of the long-term effects from radiation including a small risk for rectal bleeding and possibly erectile dysfunction. We talked about some of the general management approaches to these potential complications. However, I did encourage the patient to contact our office or return at any point if he has  questions or concerns related to his previous radiation and prostate cancer.    Sabra MICAEL Rusk, PA-C

## 2024-04-21 NOTE — Progress Notes (Signed)
 Post-seed nursing interview for a diagnosis of Prostate Cancer  Patient identity verified x2.   Patient states issues as follows...  -Pain: Yes,4/10 especially (rectum)when sitting to bottom not bad take tylenol  in evening -Fatigue: No, not as  active. -Abdomen: No -Groin: No -Urinary: Weaker stream after implant, reports slight odor to urine (reports drinking lots of water ).  Denies burning with urination.   -Bowels: Good no issues going at least twice a day. -Appetite: Good (diabetic type 2) -Weight:  195.8 lbs.  Patient denies all other related issues at this time.  Meaningful use complete.  I-PSS (AUA) score- 13 - Moderate SHIM (ED) score- 18 Urinary Management medication(s) No Urology appointment date- 04/29/2024 with Dr. Garnette Shack at 2:30 pm.  Vitals- 96.6-60-18/116/80 O2 sat 100% RA  This concludes the interaction.

## 2024-04-29 ENCOUNTER — Encounter: Payer: Self-pay | Admitting: Radiation Oncology

## 2024-04-29 ENCOUNTER — Ambulatory Visit
Admission: RE | Admit: 2024-04-29 | Discharge: 2024-04-29 | Disposition: A | Discharge status: Home / Self Care | Source: Ambulatory Visit | Attending: Radiation Oncology | Admitting: Radiation Oncology

## 2024-04-29 DIAGNOSIS — Z191 Hormone sensitive malignancy status: Secondary | ICD-10-CM | POA: Diagnosis not present

## 2024-04-29 DIAGNOSIS — C61 Malignant neoplasm of prostate: Secondary | ICD-10-CM | POA: Insufficient documentation

## 2024-04-29 NOTE — Progress Notes (Signed)
  Radiation Oncology         [phone] ________________________________  Name: Keith Braun MRN: [id number]  Date: 04/29/2024  DOB: March 23, 1961  3D Planning Note   Prostate Brachytherapy Post-Implant Dosimetry  Diagnosis: 63 y.o. gentleman with Stage T1c adenocarcinoma of the prostate with Gleason score of 3+4, and PSA of 4.4.   Narrative: On a previous date, Keith Braun returned following prostate seed implantation for post implant planning. He underwent CT scan complex simulation to delineate the three-dimensional structures of the pelvis and demonstrate the radiation distribution.  Since that time, the seed localization, and complex isodose planning with dose volume histograms have now been completed.  Results:   Prostate Coverage - The dose of radiation delivered to the 90% or more of the prostate gland (D90) was 117.59% of the prescription dose. This exceeds our goal of greater than 90%. Rectal Sparing - The volume of rectal tissue receiving the prescription dose or higher was 0.0 cc. This falls under our thresholds tolerance of 1.0 cc.  Impression: The prostate seed implant appears to show adequate target coverage and appropriate rectal sparing.  Plan:  The patient will continue to follow with urology for ongoing PSA determinations. I would anticipate a high likelihood for local tumor control with minimal risk for rectal morbidity.  ________________________________  [name], M.D.

## 2024-04-29 NOTE — Radiation Completion Notes (Signed)
 Patient Name: Keith Braun, Keith Braun MRN: [id number] Date of Birth: 1961-01-21 Referring Physician: SAMANTHA JACKSON, M.D. Date of Service: 2024-04-29 Radiation Oncologist: Adina Barge, M.D. [facility]                             RADIATION ONCOLOGY END OF TREATMENT NOTE     Diagnosis: C61 Malignant neoplasm of prostate Staging on 2023-11-03: Malignant neoplasm of prostate (HCC) T=cT1c, N=cN0, M=cM0 Intent: Curative     ==========DELIVERED PLANS==========  Prostate Seed Implant Date: 2024-03-31   Plan Name: Prostate Seed Implant Site: Prostate Technique: Radioactive Seed Implant I-125 Mode: Brachytherapy Dose Per Fraction: 145 Gy Prescribed Dose (Delivered / Prescribed): 145 Gy / 145 Gy Prescribed Fxs (Delivered / Prescribed): 1 / 1     ==========ON TREATMENT VISIT DATES========== 2024-03-31     ==========UPCOMING VISITS========== 07/05/2024 [facility] [place] OFFICE VISIT Matilda Senior, MD

## 2024-05-11 ENCOUNTER — Telehealth: Payer: Self-pay

## 2024-05-11 NOTE — Telephone Encounter (Signed)
 Notified the pt regarding his FMLA form being completed.No answer.

## 2024-06-20 DIAGNOSIS — M7742 Metatarsalgia, left foot: Secondary | ICD-10-CM | POA: Diagnosis not present

## 2024-06-20 DIAGNOSIS — L851 Acquired keratosis [keratoderma] palmaris et plantaris: Secondary | ICD-10-CM | POA: Diagnosis not present

## 2024-06-20 DIAGNOSIS — M79675 Pain in left toe(s): Secondary | ICD-10-CM | POA: Diagnosis not present

## 2024-06-27 ENCOUNTER — Encounter: Payer: Self-pay | Admitting: Radiology

## 2024-07-04 NOTE — Progress Notes (Signed)
 Impression/Assessment:  -Grade group 2 prostate cancer, low-volume, now 3 months out from his brachytherapy  - He has developed lower urinary tract symptoms  Plan: - I checked his PSA today  - I offered him to start an alpha-blocker-he would like to hold off on this  I will have him come back in 3 months for check     History of Present Illness: Here to discuss PCa mgmt.  3.11.2025: TRUS/Bx. PSA 5.5, prostate volume 32 mL, PSAD 0.17.  2 cores (Lt base lateral, Rt apex medial) revealed GG 1 and GG 2 pattern in 10 and 20% of cores, respectively.  IPSS 3/1  He does have diabetes and mild ED  Nomogram predictions: OCD-71% Seminal vesicle/lymph node involvement-3/2% respectively Progression free probability after radical prostatectomy at 5/10 years - 86/76% respectively  8.7.2025: I 125 brachytherapy/SpaceOAR placement.  11.11.2025: Here for routine follow-up.  He has had no blood or dysuria since his seeds.  Does have some urinary frequency and urgency and at times slow stream.  Past Medical History:  Diagnosis Date   Arthritis    Gout   Diabetes mellitus without complication (HCC)    Gout    Hypertension    no meds   Prostate cancer [facility])     Past Surgical History:  Procedure Laterality Date   COLONOSCOPY N/A 11/03/2013   Procedure: COLONOSCOPY;  Surgeon: Claudis RAYMOND Rivet, MD;  Location: [facility];  Service: Endoscopy;  Laterality: N/A;  830   PROSTATE BIOPSY     RADIOACTIVE SEED IMPLANT N/A 03/31/2024   Procedure: INSERTION, RADIATION SOURCE, PROSTATE;  Surgeon: Watt Rush, MD;  Location: [facility]S;  Service: Urology;  Laterality: N/A;   WISDOM TOOTH EXTRACTION      Home Medications:  Allergies as of 07/05/2024       Reactions   Tomato    Flares eczema         Medication List        Accurate as of July 04, 2024  5:15 PM. If you have any questions, ask your nurse or doctor.          acetaminophen  500 MG tablet Commonly known as:  TYLENOL  Take 500-1,000 mg by mouth every 6 (six) hours as needed for moderate pain (pain score 4-6).   HYDROcodone -acetaminophen  5-325 MG tablet Commonly known as: NORCO/VICODIN Take 1 tablet by mouth every 6 (six) hours as needed for moderate pain (pain score 4-6).   tadalafil 20 MG tablet Commonly known as: CIALIS Take 20 mg by mouth daily as needed for erectile dysfunction.        Allergies:  Allergies  Allergen Reactions   Tomato     Flares eczema     No family history on file.  Social History:  reports that he has been smoking cigarettes and cigars. He has never used smokeless tobacco. He reports current alcohol use. He reports that he does not use drugs.  ROS: A complete review of systems was performed.  All systems are negative except for pertinent findings as noted.  Physical Exam:  Vital signs in last 24 hours: There were no vitals taken for this visit. Constitutional:  Alert and oriented, No acute distress Cardiovascular: Regular rate  Respiratory: Normal respiratory effort Neurologic: Grossly intact, no focal deficits Psychiatric: Normal mood and affect  I have reviewed prior pt notes  I have reviewed urinalysis results  I have reviewed recent PSA and pathology results--patient given copy of pathology  I have reviewed  recent prostate U/S

## 2024-07-05 ENCOUNTER — Other Ambulatory Visit (HOSPITAL_COMMUNITY)
Admission: RE | Admit: 2024-07-05 | Discharge: 2024-07-05 | Disposition: A | Discharge status: Home / Self Care | Source: Ambulatory Visit | Attending: Urology | Admitting: Urology

## 2024-07-05 ENCOUNTER — Ambulatory Visit: Admitting: Urology

## 2024-07-05 VITALS — BP 94/60 | HR 77

## 2024-07-05 DIAGNOSIS — R3915 Urgency of urination: Secondary | ICD-10-CM | POA: Diagnosis not present

## 2024-07-05 DIAGNOSIS — R3912 Poor urinary stream: Secondary | ICD-10-CM

## 2024-07-05 DIAGNOSIS — R399 Unspecified symptoms and signs involving the genitourinary system: Secondary | ICD-10-CM

## 2024-07-05 DIAGNOSIS — R350 Frequency of micturition: Secondary | ICD-10-CM | POA: Diagnosis not present

## 2024-07-05 DIAGNOSIS — C61 Malignant neoplasm of prostate: Secondary | ICD-10-CM

## 2024-07-05 LAB — MICROSCOPIC EXAMINATION
Bacteria, UA: NONE SEEN
WBC, UA: NONE SEEN /HPF (ref 0–5)

## 2024-07-05 LAB — URINALYSIS, ROUTINE W REFLEX MICROSCOPIC
Bilirubin, UA: NEGATIVE
Glucose, UA: NEGATIVE
Ketones, UA: NEGATIVE
Leukocytes,UA: NEGATIVE
Nitrite, UA: NEGATIVE
Protein,UA: NEGATIVE
Specific Gravity, UA: 1.01 (ref 1.005–1.030)
Urobilinogen, Ur: 0.2 mg/dL (ref 0.2–1.0)
pH, UA: 6 (ref 5.0–7.5)

## 2024-07-05 LAB — PSA: Prostatic Specific Antigen: 2.31 ng/mL (ref 0.00–4.00)

## 2024-07-05 MED ORDER — TADALAFIL 20 MG PO TABS: 20.0000 mg | ORAL_TABLET | Freq: Every day | ORAL | 11 refills | Status: AC | PRN

## 2024-07-06 ENCOUNTER — Ambulatory Visit: Payer: Self-pay | Admitting: Urology

## 2024-10-18 ENCOUNTER — Ambulatory Visit: Admitting: Urology
